# Patient Record
Sex: Female | Born: 1935 | Race: Black or African American | Hispanic: No | Smoking: Former smoker
Health system: Southern US, Community
[De-identification: ages and names within clinical notes are randomized; demographics above are authoritative.]

## PROBLEM LIST (undated history)

## (undated) DIAGNOSIS — E7800 Pure hypercholesterolemia, unspecified: Secondary | ICD-10-CM

## (undated) DIAGNOSIS — H9190 Unspecified hearing loss, unspecified ear: Secondary | ICD-10-CM

## (undated) DIAGNOSIS — E669 Obesity, unspecified: Secondary | ICD-10-CM

## (undated) DIAGNOSIS — M1990 Unspecified osteoarthritis, unspecified site: Secondary | ICD-10-CM

## (undated) DIAGNOSIS — I10 Essential (primary) hypertension: Secondary | ICD-10-CM

## (undated) HISTORY — PX: ABDOMINAL HYSTERECTOMY: SHX81

---

## 2006-04-04 ENCOUNTER — Ambulatory Visit (HOSPITAL_COMMUNITY): Admission: RE | Admit: 2006-04-04 | Discharge: 2006-04-04 | Payer: Self-pay | Admitting: General Surgery

## 2006-04-06 ENCOUNTER — Encounter: Admission: RE | Admit: 2006-04-06 | Discharge: 2006-04-06 | Payer: Self-pay | Admitting: General Surgery

## 2015-06-17 ENCOUNTER — Ambulatory Visit (INDEPENDENT_AMBULATORY_CARE_PROVIDER_SITE_OTHER): Payer: Medicare Other | Admitting: Otolaryngology

## 2015-06-17 DIAGNOSIS — H906 Mixed conductive and sensorineural hearing loss, bilateral: Secondary | ICD-10-CM

## 2015-08-19 ENCOUNTER — Ambulatory Visit (INDEPENDENT_AMBULATORY_CARE_PROVIDER_SITE_OTHER): Payer: Medicare Other | Admitting: Otolaryngology

## 2016-07-28 ENCOUNTER — Encounter (HOSPITAL_COMMUNITY)
Admission: RE | Admit: 2016-07-28 | Discharge: 2016-07-28 | Disposition: A | Discharge status: Home / Self Care | Payer: Medicare Other | Source: Ambulatory Visit | Attending: Ophthalmology | Admitting: Ophthalmology

## 2016-07-28 ENCOUNTER — Encounter (HOSPITAL_COMMUNITY): Payer: Self-pay

## 2016-07-28 ENCOUNTER — Other Ambulatory Visit: Payer: Self-pay

## 2016-07-28 DIAGNOSIS — Z01812 Encounter for preprocedural laboratory examination: Secondary | ICD-10-CM | POA: Diagnosis present

## 2016-07-28 HISTORY — DX: Essential (primary) hypertension: I10

## 2016-07-28 HISTORY — DX: Unspecified hearing loss, unspecified ear: H91.90

## 2016-07-28 HISTORY — DX: Pure hypercholesterolemia, unspecified: E78.00

## 2016-07-28 HISTORY — DX: Unspecified osteoarthritis, unspecified site: M19.90

## 2016-07-28 LAB — CBC WITH DIFFERENTIAL/PLATELET
BASOS ABS: 0 10*3/uL (ref 0.0–0.1)
Basophils Relative: 1 %
EOS PCT: 5 %
Eosinophils Absolute: 0.2 10*3/uL (ref 0.0–0.7)
HCT: 39.2 % (ref 36.0–46.0)
HEMOGLOBIN: 12.5 g/dL (ref 12.0–15.0)
LYMPHS ABS: 1.8 10*3/uL (ref 0.7–4.0)
LYMPHS PCT: 43 %
MCH: 25.6 pg — AB (ref 26.0–34.0)
MCHC: 31.9 g/dL (ref 30.0–36.0)
MCV: 80.3 fL (ref 78.0–100.0)
Monocytes Absolute: 0.6 10*3/uL (ref 0.1–1.0)
Monocytes Relative: 14 %
NEUTROS PCT: 37 %
Neutro Abs: 1.5 10*3/uL — ABNORMAL LOW (ref 1.7–7.7)
PLATELETS: 235 10*3/uL (ref 150–400)
RBC: 4.88 MIL/uL (ref 3.87–5.11)
RDW: 14.5 % (ref 11.5–15.5)
WBC: 4.1 10*3/uL (ref 4.0–10.5)

## 2016-07-28 LAB — BASIC METABOLIC PANEL
ANION GAP: 9 (ref 5–15)
BUN: 18 mg/dL (ref 6–20)
CHLORIDE: 104 mmol/L (ref 101–111)
CO2: 26 mmol/L (ref 22–32)
Calcium: 9.4 mg/dL (ref 8.9–10.3)
Creatinine, Ser: 0.83 mg/dL (ref 0.44–1.00)
GFR calc Af Amer: >60 mL/min (ref >60)
GLUCOSE: 115 mg/dL — AB (ref 65–99)
POTASSIUM: 3.8 mmol/L (ref 3.5–5.1)
SODIUM: 139 mmol/L (ref 135–145)

## 2016-07-28 NOTE — Patient Instructions (Addendum)
Your procedure is scheduled on: 08/04/2016  Report to [facility] at  640   AM.  Call this number if you have problems the morning of surgery: [phone]   Do not eat food or drink liquids :After Midnight.      Take these medicines the morning of surgery with A SIP OF WATER: celebrex, vaseretic.   Do not wear jewelry, make-up or nail polish.  Do not wear lotions, powders, or perfumes. You may wear deodorant.  Do not shave 48 hours prior to surgery.  Do not bring valuables to the hospital.  Contacts, dentures or bridgework may not be worn into surgery.  Leave suitcase in the car. After surgery it may be brought to your room.  For patients admitted to the hospital, checkout time is 11:00 AM the day of discharge.   Patients discharged the day of surgery will not be allowed to drive home.  :     Please read over the following fact sheets that you were given: Coughing and Deep Breathing, Surgical Site Infection Prevention, Anesthesia Post-op Instructions and Care and Recovery After Surgery    Cataract A cataract is a clouding of the lens of the eye. When a lens becomes cloudy, vision is reduced based on the degree and nature of the clouding. Many cataracts reduce vision to some degree. Some cataracts make people more near-sighted as they develop. Other cataracts increase glare. Cataracts that are ignored and become worse can sometimes look white. The white color can be seen through the pupil. CAUSES   Aging. However, cataracts may occur at any age, even in newborns.   Certain drugs.   Trauma to the eye.   Certain diseases such as diabetes.   Specific eye diseases such as chronic inflammation inside the eye or a sudden attack of a rare form of glaucoma.   Inherited or acquired medical problems.  SYMPTOMS   Gradual, progressive drop in vision in the affected eye.   Severe, rapid visual loss. This most often happens when trauma is the cause.  DIAGNOSIS  To detect a cataract, an eye  doctor examines the lens. Cataracts are best diagnosed with an exam of the eyes with the pupils enlarged (dilated) by drops.  TREATMENT  For an early cataract, vision may improve by using different eyeglasses or stronger lighting. If that does not help your vision, surgery is the only effective treatment. A cataract needs to be surgically removed when vision loss interferes with your everyday activities, such as driving, reading, or watching TV. A cataract may also have to be removed if it prevents examination or treatment of another eye problem. Surgery removes the cloudy lens and usually replaces it with a substitute lens (intraocular lens, IOL).  At a time when both you and your doctor agree, the cataract will be surgically removed. If you have cataracts in both eyes, only one is usually removed at a time. This allows the operated eye to heal and be out of danger from any possible problems after surgery (such as infection or poor wound healing). In rare cases, a cataract may be doing damage to your eye. In these cases, your caregiver may advise surgical removal right away. The vast majority of people who have cataract surgery have better vision afterward. HOME CARE INSTRUCTIONS  If you are not planning surgery, you may be asked to do the following:  Use different eyeglasses.   Use stronger or brighter lighting.   Ask your eye doctor about reducing your medicine  dose or changing medicines if it is thought that a medicine caused your cataract. Changing medicines does not make the cataract go away on its own.   Become familiar with your surroundings. Poor vision can lead to injury. Avoid bumping into things on the affected side. You are at a higher risk for tripping or falling.   Exercise extreme care when driving or operating machinery.   Wear sunglasses if you are sensitive to bright light or experiencing problems with glare.  SEEK IMMEDIATE MEDICAL CARE IF:   You have a worsening or sudden  vision loss.   You notice redness, swelling, or increasing pain in the eye.   You have a fever.  Document Released: 02/06/2005 Document Revised: 01/26/2011 Document Reviewed: 09/30/2010 [facility] Patient Information 2012 [place].PATIENT INSTRUCTIONS POST-ANESTHESIA  IMMEDIATELY FOLLOWING SURGERY:  Do not drive or operate machinery for the first twenty four hours after surgery.  Do not make any important decisions for twenty four hours after surgery or while taking narcotic pain medications or sedatives.  If you develop intractable nausea and vomiting or a severe headache please notify your doctor immediately.  FOLLOW-UP:  Please make an appointment with your surgeon as instructed. You do not need to follow up with anesthesia unless specifically instructed to do so.  WOUND CARE INSTRUCTIONS (if applicable):  Keep a dry clean dressing on the anesthesia/puncture wound site if there is drainage.  Once the wound has quit draining you may leave it open to air.  Generally you should leave the bandage intact for twenty four hours unless there is drainage.  If the epidural site drains for more than 36-48 hours please call the anesthesia department.  QUESTIONS?:  Please feel free to call your physician or the hospital operator if you have any questions, and they will be happy to assist you.

## 2016-07-28 NOTE — Progress Notes (Signed)
   07/28/16 0838  OBSTRUCTIVE SLEEP APNEA  Have you ever been diagnosed with sleep apnea through a sleep study? No  Do you snore loudly (loud enough to be heard through closed doors)?  1  Do you often feel tired, fatigued, or sleepy during the daytime (such as falling asleep during driving or talking to someone)? 1  Has anyone observed you stop breathing during your sleep? 0  Do you have, or are you being treated for high blood pressure? 1  BMI more than 35 kg/m2? 1  Age > 50 (1-yes) 1  Neck circumference greater than:Female 16 inches or larger, Female 17inches or larger? 0  Female Gender (Yes=1) 0  Obstructive Sleep Apnea Score 5  Score 5 or greater  Results sent to PCP

## 2016-08-04 ENCOUNTER — Ambulatory Visit (HOSPITAL_COMMUNITY): Payer: Medicare Other | Admitting: Anesthesiology

## 2016-08-04 ENCOUNTER — Encounter (HOSPITAL_COMMUNITY): Payer: Self-pay | Admitting: *Deleted

## 2016-08-04 ENCOUNTER — Ambulatory Visit (HOSPITAL_COMMUNITY)
Admission: RE | Admit: 2016-08-04 | Discharge: 2016-08-04 | Disposition: A | Discharge status: Home / Self Care | Payer: Medicare Other | Source: Ambulatory Visit | Attending: Ophthalmology | Admitting: Ophthalmology

## 2016-08-04 ENCOUNTER — Encounter (HOSPITAL_COMMUNITY)
Admission: RE | Disposition: A | Discharge status: Home / Self Care | Payer: Self-pay | Source: Ambulatory Visit | Attending: Ophthalmology

## 2016-08-04 DIAGNOSIS — Z79899 Other long term (current) drug therapy: Secondary | ICD-10-CM | POA: Insufficient documentation

## 2016-08-04 DIAGNOSIS — I10 Essential (primary) hypertension: Secondary | ICD-10-CM | POA: Insufficient documentation

## 2016-08-04 DIAGNOSIS — E1136 Type 2 diabetes mellitus with diabetic cataract: Secondary | ICD-10-CM | POA: Insufficient documentation

## 2016-08-04 DIAGNOSIS — Z87891 Personal history of nicotine dependence: Secondary | ICD-10-CM | POA: Diagnosis not present

## 2016-08-04 DIAGNOSIS — Z7982 Long term (current) use of aspirin: Secondary | ICD-10-CM | POA: Insufficient documentation

## 2016-08-04 HISTORY — PX: CATARACT EXTRACTION W/PHACO: SHX586

## 2016-08-04 LAB — GLUCOSE, CAPILLARY: GLUCOSE-CAPILLARY: 103 mg/dL — AB (ref 65–99)

## 2016-08-04 SURGERY — PHACOEMULSIFICATION, CATARACT, WITH IOL INSERTION
Anesthesia: Monitor Anesthesia Care | Site: Eye | Laterality: Right

## 2016-08-04 MED ORDER — SODIUM HYALURONATE 23 MG/ML IO SOLN
INTRAOCULAR | Status: DC | PRN
  Administered 2016-08-04: 0.6 mL via INTRAOCULAR

## 2016-08-04 MED ORDER — POVIDONE-IODINE 5 % OP SOLN
OPHTHALMIC | Status: DC | PRN
  Administered 2016-08-04: 1 via OPHTHALMIC

## 2016-08-04 MED ORDER — TETRACAINE HCL 0.5 % OP SOLN
1.0000 [drp] | OPHTHALMIC | Status: AC
  Administered 2016-08-04 (×3): 1 [drp] via OPHTHALMIC

## 2016-08-04 MED ORDER — MIDAZOLAM HCL 2 MG/2ML IJ SOLN
1.0000 mg | INTRAMUSCULAR | Status: AC
  Administered 2016-08-04: 2 mg via INTRAVENOUS

## 2016-08-04 MED ORDER — LIDOCAINE HCL 3.5 % OP GEL
1.0000 "application " | Freq: Once | OPHTHALMIC | Status: AC
  Administered 2016-08-04: 1 via OPHTHALMIC

## 2016-08-04 MED ORDER — EPINEPHRINE PF 1 MG/ML IJ SOLN
INTRAMUSCULAR | Status: DC | PRN
  Administered 2016-08-04: 500 mL

## 2016-08-04 MED ORDER — LIDOCAINE HCL (PF) 1 % IJ SOLN
INTRAMUSCULAR | Status: DC | PRN
  Administered 2016-08-04: 1 mL via OPHTHALMIC

## 2016-08-04 MED ORDER — CYCLOPENTOLATE-PHENYLEPHRINE 0.2-1 % OP SOLN
1.0000 [drp] | OPHTHALMIC | Status: AC
  Administered 2016-08-04 (×3): 1 [drp] via OPHTHALMIC

## 2016-08-04 MED ORDER — FENTANYL CITRATE (PF) 100 MCG/2ML IJ SOLN
INTRAMUSCULAR | Status: AC
  Filled 2016-08-04: qty 2

## 2016-08-04 MED ORDER — MIDAZOLAM HCL 2 MG/2ML IJ SOLN
INTRAMUSCULAR | Status: AC
  Filled 2016-08-04: qty 2

## 2016-08-04 MED ORDER — BSS IO SOLN
INTRAOCULAR | Status: DC | PRN
  Administered 2016-08-04: 15 mL via INTRAOCULAR

## 2016-08-04 MED ORDER — PHENYLEPHRINE HCL 2.5 % OP SOLN
1.0000 [drp] | OPHTHALMIC | Status: AC
  Administered 2016-08-04 (×3): 1 [drp] via OPHTHALMIC

## 2016-08-04 MED ORDER — FENTANYL CITRATE (PF) 100 MCG/2ML IJ SOLN
25.0000 ug | Freq: Once | INTRAMUSCULAR | Status: AC
  Administered 2016-08-04: 25 ug via INTRAVENOUS

## 2016-08-04 MED ORDER — LACTATED RINGERS IV SOLN
INTRAVENOUS | Status: DC
  Administered 2016-08-04: 08:00:00 via INTRAVENOUS

## 2016-08-04 MED ORDER — NEOMYCIN-POLYMYXIN-DEXAMETH 3.5-10000-0.1 OP SUSP
OPHTHALMIC | Status: DC | PRN
  Administered 2016-08-04: 2 [drp] via OPHTHALMIC

## 2016-08-04 MED ORDER — PROVISC 10 MG/ML IO SOLN
INTRAOCULAR | Status: DC | PRN
  Administered 2016-08-04: 0.85 mL via INTRAOCULAR

## 2016-08-04 SURGICAL SUPPLY — 11 items
CLOTH BEACON ORANGE TIMEOUT ST (SAFETY) ×2 IMPLANT
EYE SHIELD UNIVERSAL CLEAR (GAUZE/BANDAGES/DRESSINGS) ×2 IMPLANT
GLOVE BIOGEL PI IND STRL 6.5 (GLOVE) IMPLANT
GLOVE BIOGEL PI INDICATOR 6.5 (GLOVE) ×4
LENS ALC ACRYL/TECN (Ophthalmic Related) ×2 IMPLANT
NDL HYPO 18GX1.5 BLUNT FILL (NEEDLE) IMPLANT
NEEDLE HYPO 18GX1.5 BLUNT FILL (NEEDLE) ×3 IMPLANT
PAD ARMBOARD 7.5X6 YLW CONV (MISCELLANEOUS) ×2 IMPLANT
SYR TB 1ML LL NO SAFETY (SYRINGE) ×2 IMPLANT
TAPE TRANSPARENT 1/2IN (GAUZE/BANDAGES/DRESSINGS) ×2 IMPLANT
WATER STERILE IRR 250ML POUR (IV SOLUTION) ×2 IMPLANT

## 2016-08-04 NOTE — Anesthesia Postprocedure Evaluation (Signed)
Anesthesia Post Note  Patient: Megan Roberts  Procedure(s) Performed: Procedure(s) (LRB): CATARACT EXTRACTION PHACO AND INTRAOCULAR LENS PLACEMENT RIGHT EYE (Right)  Patient location during evaluation: Short Stay Anesthesia Type: MAC Level of consciousness: awake and patient cooperative Pain management: pain level controlled Vital Signs Assessment: post-procedure vital signs reviewed and stable Respiratory status: spontaneous breathing, nonlabored ventilation and respiratory function stable Cardiovascular status: blood pressure returned to baseline Postop Assessment: no signs of nausea or vomiting Anesthetic complications: no     Last Vitals:  Vitals:   08/04/16 0810 08/04/16 0815  BP: 120/60 123/61  Pulse:    Resp: 20 15  Temp:      Last Pain:  Vitals:   08/04/16 0716  TempSrc: Oral                 Addie Alonge J

## 2016-08-04 NOTE — Discharge Instructions (Signed)
Please discharge patient when stable, will follow up today [facility] at [facility]:40[facility].  Leave shield in place until visit.  All paperwork with discharge instructions will be given at the office.

## 2016-08-04 NOTE — Anesthesia Preprocedure Evaluation (Signed)
Anesthesia Evaluation  Patient identified by MRN, date of birth, ID band Patient awake    Reviewed: Allergy & Precautions, NPO status , Patient's Chart, lab work & pertinent test results  Airway        Dental   Pulmonary former smoker,           Cardiovascular hypertension, Pt. on medications      Neuro/Psych    GI/Hepatic negative GI ROS, Neg liver ROS,   Endo/Other    Renal/GU      Musculoskeletal   Abdominal   Peds  Hematology   Anesthesia Other Findings   Reproductive/Obstetrics                             Anesthesia Physical Anesthesia Plan  ASA: II  Anesthesia Plan: MAC   Post-op Pain Management:    Induction: Intravenous  PONV Risk Score and Plan:   Airway Management Planned: Nasal Cannula  Additional Equipment:   Intra-op Plan:   Post-operative Plan:   Informed Consent: I have reviewed the patients History and Physical, chart, labs and discussed the procedure including the risks, benefits and alternatives for the proposed anesthesia with the patient or authorized representative who has indicated his/her understanding and acceptance.     Plan Discussed with:   Anesthesia Plan Comments:         Anesthesia Quick Evaluation

## 2016-08-04 NOTE — Transfer of Care (Signed)
Immediate Anesthesia Transfer of Care Note  Patient: Megan Roberts  Procedure(s) Performed: Procedure(s) with comments: CATARACT EXTRACTION PHACO AND INTRAOCULAR LENS PLACEMENT RIGHT EYE (Right) - CDE: 20.23  Patient Location: Short Stay  Anesthesia Type:MAC  Level of Consciousness: awake and patient cooperative  Airway & Oxygen Therapy: Patient Spontanous Breathing  Post-op Assessment: Report given to RN  Post vital signs: Reviewed and stable  Last Vitals:  Vitals:   08/04/16 0810 08/04/16 0815  BP: 120/60 123/61  Pulse:    Resp: 20 15  Temp:      Last Pain:  Vitals:   08/04/16 0716  TempSrc: Oral      Patients Stated Pain Goal: 5 (44/81/85 6314)  Complications: No apparent anesthesia complications

## 2016-08-04 NOTE — Op Note (Signed)
Date of procedure: 08/04/16  Pre-operative diagnosis: Visually significant cataract, Right Eye  Post-operative diagnosis: Visually significant cataract, Right Eye  Procedure: Removal of cataract via phacoemulsification and insertion of intra-ocular lens AMO PCB00  +22.0D into the capsular bag of the Right Eye  Attending surgeon: Gerda Diss. Brandey Vandalen, MD, MA  Anesthesia: MAC, Topical [place]  Complications: None  Estimated Blood Loss: <13m (minimal)  Specimens: None  Implants: As above  Indications:  Visually significant cataract, Right Eye  Procedure:  The patient was seen and identified in the pre-operative area. The operative eye was identified and dilated.  The operative eye was marked.  Topical anesthesia was administered to the operative eye.     The patient was then to the operative suite and placed in the supine position.  A timeout was performed confirming the patient, procedure to be performed, and all other relevant information.   The patient's face was prepped and draped in the usual fashion for intra-ocular surgery.  A lid speculum was placed into the operative eye and the surgical microscope moved into place and focused.  A superotemporal paracentesis was created using a 20 gauge paracentesis blade.  Shugarcaine was injected into the anterior chamber.  Viscoelastic was injected into the anterior chamber.  A temporal clear-corneal main wound incision was created using a 2.455mmicrokeratome.  A continuous curvilinear capsulorrhexis was initiated using an irrigating cystitome and completed using capsulorrhexis forceps.  Hydrodissection and hydrodeliniation were performed.  Viscoelastic was injected into the anterior chamber.  A phacoemulsification handpiece and a chopper as a second instrument were used to remove the nucleus and epinucleus. The irrigation/aspiration handpiece was used to remove any remaining cortical material.   The capsular bag was reinflated with viscoelastic,  checked, and found to be intact.  The intraocular lens was inserted into the capsular bag and dialed into place using a Kuglen hook.  The irrigation/aspiration handpiece was used to remove any remaining viscoelastic.  The clear corneal wound and paracentesis wounds were then hydrated and checked with Weck-Cels to be watertight.  The lid-speculum and drape was removed, and the patient's face was cleaned with a wet and dry 4x4.  Maxitrol was instilled in the eye before a clear shield was taped over the eye. The patient was taken to the post-operative care unit in good condition, having tolerated the procedure well.  Post-Op Instructions: [facility] and will receive all other orders and instructions.

## 2016-08-04 NOTE — H&P (Signed)
The H and P was reviewed and updated. The patient was examined.  No changes were found after exam.  The surgical eye was marked.  

## 2016-08-07 ENCOUNTER — Encounter (HOSPITAL_COMMUNITY): Payer: Self-pay | Admitting: Ophthalmology

## 2016-08-22 ENCOUNTER — Encounter (HOSPITAL_COMMUNITY): Payer: Self-pay

## 2016-08-22 ENCOUNTER — Encounter (HOSPITAL_COMMUNITY)
Admission: RE | Admit: 2016-08-22 | Discharge: 2016-08-22 | Disposition: A | Discharge status: Home / Self Care | Payer: Medicare Other | Source: Ambulatory Visit | Attending: Ophthalmology | Admitting: Ophthalmology

## 2016-09-04 ENCOUNTER — Encounter (HOSPITAL_COMMUNITY): Payer: Self-pay

## 2016-09-04 ENCOUNTER — Encounter (HOSPITAL_COMMUNITY)
Admission: RE | Admit: 2016-09-04 | Discharge: 2016-09-04 | Disposition: A | Discharge status: Home / Self Care | Payer: Medicare Other | Source: Ambulatory Visit | Attending: Ophthalmology | Admitting: Ophthalmology

## 2016-10-16 NOTE — Patient Instructions (Signed)
Your procedure is scheduled on: 10/27/2016   Report to [facility] at  710  AM.  Call this number if you have problems the morning of surgery: [phone]   Do not eat food or drink liquids :After Midnight.      Take these medicines the morning of surgery with A SIP OF WATER: vaseretic.   Do not wear jewelry, make-up or nail polish.  Do not wear lotions, powders, or perfumes. You may wear deodorant.  Do not shave 48 hours prior to surgery.  Do not bring valuables to the hospital.  Contacts, dentures or bridgework may not be worn into surgery.  Leave suitcase in the car. After surgery it may be brought to your room.  For patients admitted to the hospital, checkout time is 11:00 AM the day of discharge.   Patients discharged the day of surgery will not be allowed to drive home.  :     Please read over the following fact sheets that you were given: Coughing and Deep Breathing, Surgical Site Infection Prevention, Anesthesia Post-op Instructions and Care and Recovery After Surgery    Cataract A cataract is a clouding of the lens of the eye. When a lens becomes cloudy, vision is reduced based on the degree and nature of the clouding. Many cataracts reduce vision to some degree. Some cataracts make people more near-sighted as they develop. Other cataracts increase glare. Cataracts that are ignored and become worse can sometimes look white. The white color can be seen through the pupil. CAUSES   Aging. However, cataracts may occur at any age, even in newborns.   Certain drugs.   Trauma to the eye.   Certain diseases such as diabetes.   Specific eye diseases such as chronic inflammation inside the eye or a sudden attack of a rare form of glaucoma.   Inherited or acquired medical problems.  SYMPTOMS   Gradual, progressive drop in vision in the affected eye.   Severe, rapid visual loss. This most often happens when trauma is the cause.  DIAGNOSIS  To detect a cataract, an eye doctor  examines the lens. Cataracts are best diagnosed with an exam of the eyes with the pupils enlarged (dilated) by drops.  TREATMENT  For an early cataract, vision may improve by using different eyeglasses or stronger lighting. If that does not help your vision, surgery is the only effective treatment. A cataract needs to be surgically removed when vision loss interferes with your everyday activities, such as driving, reading, or watching TV. A cataract may also have to be removed if it prevents examination or treatment of another eye problem. Surgery removes the cloudy lens and usually replaces it with a substitute lens (intraocular lens, IOL).  At a time when both you and your doctor agree, the cataract will be surgically removed. If you have cataracts in both eyes, only one is usually removed at a time. This allows the operated eye to heal and be out of danger from any possible problems after surgery (such as infection or poor wound healing). In rare cases, a cataract may be doing damage to your eye. In these cases, your caregiver may advise surgical removal right away. The vast majority of people who have cataract surgery have better vision afterward. HOME CARE INSTRUCTIONS  If you are not planning surgery, you may be asked to do the following:  Use different eyeglasses.   Use stronger or brighter lighting.   Ask your eye doctor about reducing your medicine dose  or changing medicines if it is thought that a medicine caused your cataract. Changing medicines does not make the cataract go away on its own.   Become familiar with your surroundings. Poor vision can lead to injury. Avoid bumping into things on the affected side. You are at a higher risk for tripping or falling.   Exercise extreme care when driving or operating machinery.   Wear sunglasses if you are sensitive to bright light or experiencing problems with glare.  SEEK IMMEDIATE MEDICAL CARE IF:   You have a worsening or sudden vision  loss.   You notice redness, swelling, or increasing pain in the eye.   You have a fever.  Document Released: 02/06/2005 Document Revised: 01/26/2011 Document Reviewed: 09/30/2010 [facility] Patient Information 2012 [place].PATIENT INSTRUCTIONS POST-ANESTHESIA  IMMEDIATELY FOLLOWING SURGERY:  Do not drive or operate machinery for the first twenty four hours after surgery.  Do not make any important decisions for twenty four hours after surgery or while taking narcotic pain medications or sedatives.  If you develop intractable nausea and vomiting or a severe headache please notify your doctor immediately.  FOLLOW-UP:  Please make an appointment with your surgeon as instructed. You do not need to follow up with anesthesia unless specifically instructed to do so.  WOUND CARE INSTRUCTIONS (if applicable):  Keep a dry clean dressing on the anesthesia/puncture wound site if there is drainage.  Once the wound has quit draining you may leave it open to air.  Generally you should leave the bandage intact for twenty four hours unless there is drainage.  If the epidural site drains for more than 36-48 hours please call the anesthesia department.  QUESTIONS?:  Please feel free to call your physician or the hospital operator if you have any questions, and they will be happy to assist you.

## 2016-10-20 ENCOUNTER — Encounter (HOSPITAL_COMMUNITY)
Admission: RE | Admit: 2016-10-20 | Discharge: 2016-10-20 | Disposition: A | Discharge status: Home / Self Care | Payer: Medicare Other | Source: Ambulatory Visit | Attending: Ophthalmology | Admitting: Ophthalmology

## 2016-10-27 ENCOUNTER — Encounter (HOSPITAL_COMMUNITY): Admission: RE | Payer: Self-pay | Source: Ambulatory Visit

## 2016-10-27 ENCOUNTER — Ambulatory Visit (HOSPITAL_COMMUNITY): Admission: RE | Admit: 2016-10-27 | Payer: Medicare Other | Source: Ambulatory Visit | Admitting: Ophthalmology

## 2016-10-27 SURGERY — PHACOEMULSIFICATION, CATARACT, WITH IOL INSERTION
Anesthesia: Monitor Anesthesia Care | Laterality: Left

## 2016-11-06 ENCOUNTER — Inpatient Hospital Stay (HOSPITAL_COMMUNITY)
Admission: EM | Admit: 2016-11-06 | Discharge: 2016-11-20 | DRG: 871 | Disposition: E | Payer: Medicare Other | Attending: Pulmonary Disease | Admitting: Pulmonary Disease

## 2016-11-06 ENCOUNTER — Encounter (HOSPITAL_COMMUNITY): Payer: Self-pay | Admitting: Emergency Medicine

## 2016-11-06 DIAGNOSIS — E7800 Pure hypercholesterolemia, unspecified: Secondary | ICD-10-CM | POA: Diagnosis present

## 2016-11-06 DIAGNOSIS — K922 Gastrointestinal hemorrhage, unspecified: Secondary | ICD-10-CM

## 2016-11-06 DIAGNOSIS — K559 Vascular disorder of intestine, unspecified: Secondary | ICD-10-CM | POA: Diagnosis present

## 2016-11-06 DIAGNOSIS — I10 Essential (primary) hypertension: Secondary | ICD-10-CM | POA: Diagnosis present

## 2016-11-06 DIAGNOSIS — I469 Cardiac arrest, cause unspecified: Secondary | ICD-10-CM

## 2016-11-06 DIAGNOSIS — R569 Unspecified convulsions: Secondary | ICD-10-CM | POA: Diagnosis present

## 2016-11-06 DIAGNOSIS — N170 Acute kidney failure with tubular necrosis: Secondary | ICD-10-CM | POA: Diagnosis present

## 2016-11-06 DIAGNOSIS — D65 Disseminated intravascular coagulation [defibrination syndrome]: Secondary | ICD-10-CM | POA: Diagnosis not present

## 2016-11-06 DIAGNOSIS — Z789 Other specified health status: Secondary | ICD-10-CM

## 2016-11-06 DIAGNOSIS — R739 Hyperglycemia, unspecified: Secondary | ICD-10-CM | POA: Diagnosis present

## 2016-11-06 DIAGNOSIS — R6521 Severe sepsis with septic shock: Secondary | ICD-10-CM | POA: Diagnosis present

## 2016-11-06 DIAGNOSIS — R570 Cardiogenic shock: Secondary | ICD-10-CM | POA: Diagnosis present

## 2016-11-06 DIAGNOSIS — Z4659 Encounter for fitting and adjustment of other gastrointestinal appliance and device: Secondary | ICD-10-CM

## 2016-11-06 DIAGNOSIS — R791 Abnormal coagulation profile: Secondary | ICD-10-CM | POA: Diagnosis present

## 2016-11-06 DIAGNOSIS — I4510 Unspecified right bundle-branch block: Secondary | ICD-10-CM | POA: Diagnosis present

## 2016-11-06 DIAGNOSIS — H9190 Unspecified hearing loss, unspecified ear: Secondary | ICD-10-CM | POA: Diagnosis present

## 2016-11-06 DIAGNOSIS — Z6841 Body Mass Index (BMI) 40.0 and over, adult: Secondary | ICD-10-CM

## 2016-11-06 DIAGNOSIS — R34 Anuria and oliguria: Secondary | ICD-10-CM | POA: Diagnosis present

## 2016-11-06 DIAGNOSIS — J9601 Acute respiratory failure with hypoxia: Secondary | ICD-10-CM | POA: Diagnosis present

## 2016-11-06 DIAGNOSIS — R0989 Other specified symptoms and signs involving the circulatory and respiratory systems: Secondary | ICD-10-CM

## 2016-11-06 DIAGNOSIS — Z87891 Personal history of nicotine dependence: Secondary | ICD-10-CM

## 2016-11-06 DIAGNOSIS — Z66 Do not resuscitate: Secondary | ICD-10-CM | POA: Diagnosis present

## 2016-11-06 DIAGNOSIS — K7200 Acute and subacute hepatic failure without coma: Secondary | ICD-10-CM | POA: Diagnosis present

## 2016-11-06 DIAGNOSIS — Z961 Presence of intraocular lens: Secondary | ICD-10-CM | POA: Diagnosis present

## 2016-11-06 DIAGNOSIS — A419 Sepsis, unspecified organism: Secondary | ICD-10-CM | POA: Diagnosis not present

## 2016-11-06 DIAGNOSIS — D649 Anemia, unspecified: Secondary | ICD-10-CM | POA: Diagnosis present

## 2016-11-06 DIAGNOSIS — Z90710 Acquired absence of both cervix and uterus: Secondary | ICD-10-CM

## 2016-11-06 DIAGNOSIS — E872 Acidosis: Secondary | ICD-10-CM | POA: Diagnosis present

## 2016-11-06 DIAGNOSIS — G931 Anoxic brain damage, not elsewhere classified: Secondary | ICD-10-CM | POA: Diagnosis present

## 2016-11-06 DIAGNOSIS — J181 Lobar pneumonia, unspecified organism: Secondary | ICD-10-CM

## 2016-11-06 DIAGNOSIS — Z7982 Long term (current) use of aspirin: Secondary | ICD-10-CM

## 2016-11-06 DIAGNOSIS — E785 Hyperlipidemia, unspecified: Secondary | ICD-10-CM | POA: Diagnosis present

## 2016-11-06 DIAGNOSIS — I214 Non-ST elevation (NSTEMI) myocardial infarction: Secondary | ICD-10-CM | POA: Diagnosis present

## 2016-11-06 DIAGNOSIS — K921 Melena: Secondary | ICD-10-CM | POA: Diagnosis present

## 2016-11-06 DIAGNOSIS — J189 Pneumonia, unspecified organism: Secondary | ICD-10-CM | POA: Diagnosis present

## 2016-11-06 DIAGNOSIS — Z79899 Other long term (current) drug therapy: Secondary | ICD-10-CM

## 2016-11-06 HISTORY — DX: Obesity, unspecified: E66.9

## 2016-11-06 LAB — CBG MONITORING, ED: GLUCOSE-CAPILLARY: 276 mg/dL — AB (ref 65–99)

## 2016-11-07 ENCOUNTER — Inpatient Hospital Stay (HOSPITAL_COMMUNITY): Payer: Medicare Other

## 2016-11-07 ENCOUNTER — Emergency Department (HOSPITAL_COMMUNITY): Payer: Medicare Other

## 2016-11-07 ENCOUNTER — Other Ambulatory Visit (HOSPITAL_COMMUNITY): Payer: Medicare Other

## 2016-11-07 DIAGNOSIS — Z789 Other specified health status: Secondary | ICD-10-CM

## 2016-11-07 DIAGNOSIS — R569 Unspecified convulsions: Secondary | ICD-10-CM | POA: Diagnosis present

## 2016-11-07 DIAGNOSIS — J189 Pneumonia, unspecified organism: Secondary | ICD-10-CM | POA: Diagnosis present

## 2016-11-07 DIAGNOSIS — Z66 Do not resuscitate: Secondary | ICD-10-CM | POA: Diagnosis present

## 2016-11-07 DIAGNOSIS — N170 Acute kidney failure with tubular necrosis: Secondary | ICD-10-CM | POA: Diagnosis present

## 2016-11-07 DIAGNOSIS — I4510 Unspecified right bundle-branch block: Secondary | ICD-10-CM | POA: Diagnosis not present

## 2016-11-07 DIAGNOSIS — D649 Anemia, unspecified: Secondary | ICD-10-CM | POA: Diagnosis present

## 2016-11-07 DIAGNOSIS — R0989 Other specified symptoms and signs involving the circulatory and respiratory systems: Secondary | ICD-10-CM | POA: Diagnosis not present

## 2016-11-07 DIAGNOSIS — H9190 Unspecified hearing loss, unspecified ear: Secondary | ICD-10-CM | POA: Diagnosis present

## 2016-11-07 DIAGNOSIS — K559 Vascular disorder of intestine, unspecified: Secondary | ICD-10-CM | POA: Diagnosis present

## 2016-11-07 DIAGNOSIS — I469 Cardiac arrest, cause unspecified: Secondary | ICD-10-CM | POA: Diagnosis present

## 2016-11-07 DIAGNOSIS — R739 Hyperglycemia, unspecified: Secondary | ICD-10-CM | POA: Diagnosis present

## 2016-11-07 DIAGNOSIS — J181 Lobar pneumonia, unspecified organism: Secondary | ICD-10-CM

## 2016-11-07 DIAGNOSIS — J9601 Acute respiratory failure with hypoxia: Secondary | ICD-10-CM | POA: Insufficient documentation

## 2016-11-07 DIAGNOSIS — A419 Sepsis, unspecified organism: Secondary | ICD-10-CM | POA: Diagnosis present

## 2016-11-07 DIAGNOSIS — K922 Gastrointestinal hemorrhage, unspecified: Secondary | ICD-10-CM

## 2016-11-07 DIAGNOSIS — G931 Anoxic brain damage, not elsewhere classified: Secondary | ICD-10-CM | POA: Diagnosis present

## 2016-11-07 DIAGNOSIS — Z6841 Body Mass Index (BMI) 40.0 and over, adult: Secondary | ICD-10-CM | POA: Diagnosis not present

## 2016-11-07 DIAGNOSIS — R791 Abnormal coagulation profile: Secondary | ICD-10-CM | POA: Diagnosis present

## 2016-11-07 DIAGNOSIS — R34 Anuria and oliguria: Secondary | ICD-10-CM | POA: Diagnosis present

## 2016-11-07 DIAGNOSIS — E872 Acidosis: Secondary | ICD-10-CM | POA: Diagnosis present

## 2016-11-07 DIAGNOSIS — E785 Hyperlipidemia, unspecified: Secondary | ICD-10-CM | POA: Diagnosis present

## 2016-11-07 DIAGNOSIS — I10 Essential (primary) hypertension: Secondary | ICD-10-CM | POA: Diagnosis present

## 2016-11-07 DIAGNOSIS — I214 Non-ST elevation (NSTEMI) myocardial infarction: Secondary | ICD-10-CM | POA: Diagnosis present

## 2016-11-07 DIAGNOSIS — R6521 Severe sepsis with septic shock: Secondary | ICD-10-CM | POA: Diagnosis present

## 2016-11-07 DIAGNOSIS — R570 Cardiogenic shock: Secondary | ICD-10-CM | POA: Diagnosis present

## 2016-11-07 DIAGNOSIS — D65 Disseminated intravascular coagulation [defibrination syndrome]: Secondary | ICD-10-CM | POA: Diagnosis not present

## 2016-11-07 DIAGNOSIS — K921 Melena: Secondary | ICD-10-CM | POA: Diagnosis present

## 2016-11-07 DIAGNOSIS — K7200 Acute and subacute hepatic failure without coma: Secondary | ICD-10-CM | POA: Diagnosis present

## 2016-11-07 LAB — COMPREHENSIVE METABOLIC PANEL
ALK PHOS: 90 U/L (ref 38–126)
ALT: 211 U/L — ABNORMAL HIGH (ref 14–54)
ANION GAP: 9 (ref 5–15)
AST: 478 U/L — ABNORMAL HIGH (ref 15–41)
Albumin: 2 g/dL — ABNORMAL LOW (ref 3.5–5.0)
BILIRUBIN TOTAL: 0.9 mg/dL (ref 0.3–1.2)
BUN: 18 mg/dL (ref 6–20)
CO2: 16 mmol/L — AB (ref 22–32)
CREATININE: 2.16 mg/dL — AB (ref 0.44–1.00)
Calcium: 7.1 mg/dL — ABNORMAL LOW (ref 8.9–10.3)
Chloride: 115 mmol/L — ABNORMAL HIGH (ref 101–111)
GFR calc non Af Amer: 21 mL/min — ABNORMAL LOW (ref >60)
GFR, EST AFRICAN AMERICAN: 24 mL/min — AB (ref >60)
GLUCOSE: 164 mg/dL — AB (ref 65–99)
Potassium: 4.4 mmol/L (ref 3.5–5.1)
SODIUM: 140 mmol/L (ref 135–145)
TOTAL PROTEIN: 4 g/dL — AB (ref 6.5–8.1)

## 2016-11-07 LAB — I-STAT ARTERIAL BLOOD GAS, ED
ACID-BASE DEFICIT: 11 mmol/L — AB (ref 0.0–2.0)
BICARBONATE: 19.8 mmol/L — AB (ref 20.0–28.0)
O2 Saturation: 95 %
TCO2: 22 mmol/L (ref 22–32)
pCO2 arterial: 69.4 mmHg (ref 32.0–48.0)
pH, Arterial: 7.062 — CL (ref 7.350–7.450)
pO2, Arterial: 109 mmHg — ABNORMAL HIGH (ref 83.0–108.0)

## 2016-11-07 LAB — TYPE AND SCREEN
ABO/RH(D): O POS
ANTIBODY SCREEN: NEGATIVE
UNIT DIVISION: 0
UNIT DIVISION: 0
Unit division: 0
Unit division: 0

## 2016-11-07 LAB — BLOOD GAS, ARTERIAL
Acid-base deficit: 12.5 mmol/L — ABNORMAL HIGH (ref 0.0–2.0)
BICARBONATE: 15.1 mmol/L — AB (ref 20.0–28.0)
Drawn by: 398981
FIO2: 100
LHR: 26 {breaths}/min
O2 SAT: 96 %
PATIENT TEMPERATURE: 96.3
PEEP: 5 cmH2O
VT: 530 mL
pCO2 arterial: 45.2 mmHg (ref 32.0–48.0)
pH, Arterial: 7.139 — CL (ref 7.350–7.450)
pO2, Arterial: 107 mmHg (ref 83.0–108.0)

## 2016-11-07 LAB — POCT I-STAT 3, ART BLOOD GAS (G3+)
ACID-BASE DEFICIT: 18 mmol/L — AB (ref 0.0–2.0)
BICARBONATE: 12.9 mmol/L — AB (ref 20.0–28.0)
O2 SAT: 95 %
PH ART: 6.997 — AB (ref 7.350–7.450)
TCO2: 15 mmol/L — AB (ref 22–32)
pCO2 arterial: 51.9 mmHg — ABNORMAL HIGH (ref 32.0–48.0)
pO2, Arterial: 108 mmHg (ref 83.0–108.0)

## 2016-11-07 LAB — HEMOGLOBIN A1C
HEMOGLOBIN A1C: 6.1 % — AB (ref 4.8–5.6)
Mean Plasma Glucose: 128.37 mg/dL

## 2016-11-07 LAB — I-STAT CHEM 8, ED
BUN: 23 mg/dL — AB (ref 6–20)
CALCIUM ION: 1.09 mmol/L — AB (ref 1.15–1.40)
CHLORIDE: 105 mmol/L (ref 101–111)
Creatinine, Ser: 1.4 mg/dL — ABNORMAL HIGH (ref 0.44–1.00)
GLUCOSE: 283 mg/dL — AB (ref 65–99)
HCT: 41 % (ref 36.0–46.0)
Hemoglobin: 13.9 g/dL (ref 12.0–15.0)
Potassium: 4.3 mmol/L (ref 3.5–5.1)
Sodium: 138 mmol/L (ref 135–145)
TCO2: 18 mmol/L — AB (ref 22–32)

## 2016-11-07 LAB — I-STAT TROPONIN, ED: Troponin i, poc: 0.41 ng/mL (ref 0.00–0.08)

## 2016-11-07 LAB — I-STAT CG4 LACTIC ACID, ED: Lactic Acid, Venous: 10.95 mmol/L (ref 0.5–1.9)

## 2016-11-07 LAB — LACTIC ACID, PLASMA
LACTIC ACID, VENOUS: 8 mmol/L — AB (ref 0.5–1.9)
LACTIC ACID, VENOUS: 11.3 mmol/L — AB (ref 0.5–1.9)

## 2016-11-07 LAB — CBC WITH DIFFERENTIAL/PLATELET
BASOS PCT: 0 %
Basophils Absolute: 0 10*3/uL (ref 0.0–0.1)
Eosinophils Absolute: 0.1 10*3/uL (ref 0.0–0.7)
Eosinophils Relative: 0 %
HEMATOCRIT: 28.9 % — AB (ref 36.0–46.0)
HEMOGLOBIN: 8.9 g/dL — AB (ref 12.0–15.0)
LYMPHS ABS: 3.8 10*3/uL (ref 0.7–4.0)
LYMPHS PCT: 22 %
MCH: 25.4 pg — AB (ref 26.0–34.0)
MCHC: 30.8 g/dL (ref 30.0–36.0)
MCV: 82.3 fL (ref 78.0–100.0)
MONO ABS: 1.4 10*3/uL — AB (ref 0.1–1.0)
MONOS PCT: 8 %
NEUTROS ABS: 11.8 10*3/uL — AB (ref 1.7–7.7)
NEUTROS PCT: 69 %
Platelets: 120 10*3/uL — ABNORMAL LOW (ref 150–400)
RBC: 3.51 MIL/uL — ABNORMAL LOW (ref 3.87–5.11)
RDW: 14.7 % (ref 11.5–15.5)
WBC: 17.1 10*3/uL — ABNORMAL HIGH (ref 4.0–10.5)

## 2016-11-07 LAB — GLUCOSE, CAPILLARY: Glucose-Capillary: 161 mg/dL — ABNORMAL HIGH (ref 65–99)

## 2016-11-07 LAB — D-DIMER, QUANTITATIVE: D-Dimer, Quant: >20 ug/mL-FEU — ABNORMAL HIGH (ref 0.00–0.50)

## 2016-11-07 LAB — BPAM RBC
BLOOD PRODUCT EXPIRATION DATE: 201809222359
BLOOD PRODUCT EXPIRATION DATE: 201809242359
BLOOD PRODUCT EXPIRATION DATE: 201810162359
Blood Product Expiration Date: 201810162359
ISSUE DATE / TIME: 201809121125
UNIT TYPE AND RH: 5100
UNIT TYPE AND RH: 5100
Unit Type and Rh: 5100
Unit Type and Rh: 5100

## 2016-11-07 LAB — MAGNESIUM: Magnesium: 2 mg/dL (ref 1.7–2.4)

## 2016-11-07 LAB — MRSA PCR SCREENING: MRSA BY PCR: NEGATIVE

## 2016-11-07 LAB — ABO/RH: ABO/RH(D): O POS

## 2016-11-07 LAB — PHOSPHORUS: Phosphorus: 8.2 mg/dL — ABNORMAL HIGH (ref 2.5–4.6)

## 2016-11-07 LAB — APTT: APTT: 134 s — AB (ref 24–36)

## 2016-11-07 LAB — PREPARE RBC (CROSSMATCH)

## 2016-11-07 LAB — PROTIME-INR
INR: 3.97
PROTHROMBIN TIME: 38.5 s — AB (ref 11.4–15.2)

## 2016-11-07 LAB — BRAIN NATRIURETIC PEPTIDE: B Natriuretic Peptide: 571.7 pg/mL — ABNORMAL HIGH (ref 0.0–100.0)

## 2016-11-07 LAB — POC OCCULT BLOOD, ED: FECAL OCCULT BLD: POSITIVE — AB

## 2016-11-07 MED ORDER — SODIUM CHLORIDE 0.9 % IV SOLN
80.0000 mg | Freq: Once | INTRAVENOUS | Status: AC
  Administered 2016-11-07: 80 mg via INTRAVENOUS
  Filled 2016-11-07: qty 80

## 2016-11-07 MED ORDER — VANCOMYCIN HCL 10 G IV SOLR
1500.0000 mg | INTRAVENOUS | Status: DC
  Administered 2016-11-07: 1500 mg via INTRAVENOUS
  Filled 2016-11-07: qty 1500

## 2016-11-07 MED ORDER — PANTOPRAZOLE SODIUM 40 MG IV SOLR: 40.0000 mg | Freq: Two times a day (BID) | INTRAVENOUS | Status: DC

## 2016-11-07 MED ORDER — FENTANYL 2500MCG IN NS 250ML (10MCG/ML) PREMIX INFUSION: 25.0000 ug/h | INTRAVENOUS | Status: DC

## 2016-11-07 MED ORDER — FENTANYL CITRATE (PF) 100 MCG/2ML IJ SOLN: 50.0000 ug | Freq: Once | INTRAMUSCULAR | Status: DC

## 2016-11-07 MED ORDER — EPINEPHRINE PF 1 MG/10ML IJ SOSY
0.1000 mg | PREFILLED_SYRINGE | Freq: Once | INTRAMUSCULAR | Status: AC
  Administered 2016-11-07: 0.1 mg via INTRAVENOUS

## 2016-11-07 MED ORDER — SODIUM CHLORIDE 0.9 % IV BOLUS (SEPSIS)
1000.0000 mL | Freq: Once | INTRAVENOUS | Status: AC
  Administered 2016-11-07: 1000 mL via INTRAVENOUS

## 2016-11-07 MED ORDER — LEVOFLOXACIN IN D5W 750 MG/150ML IV SOLN: 750.0000 mg | INTRAVENOUS | Status: DC

## 2016-11-07 MED ORDER — SODIUM CHLORIDE 0.9 % IV SOLN: 250.0000 mL | INTRAVENOUS | Status: DC | PRN

## 2016-11-07 MED ORDER — HYDROCORTISONE NA SUCCINATE PF 100 MG IJ SOLR
100.0000 mg | Freq: Three times a day (TID) | INTRAMUSCULAR | Status: DC
  Administered 2016-11-07: 100 mg via INTRAVENOUS
  Filled 2016-11-07: qty 2

## 2016-11-07 MED ORDER — HEPARIN BOLUS VIA INFUSION
4000.0000 [IU] | Freq: Once | INTRAVENOUS | Status: DC
  Filled 2016-11-07: qty 4000

## 2016-11-07 MED ORDER — HEPARIN (PORCINE) IN NACL 100-0.45 UNIT/ML-% IJ SOLN
1400.0000 [IU]/h | INTRAMUSCULAR | Status: DC
  Filled 2016-11-07: qty 250

## 2016-11-07 MED ORDER — SODIUM CHLORIDE 0.9 % IV SOLN: INTRAVENOUS | Status: DC

## 2016-11-07 MED ORDER — FENTANYL BOLUS VIA INFUSION
25.0000 ug | INTRAVENOUS | Status: DC | PRN
  Filled 2016-11-07: qty 25

## 2016-11-07 MED ORDER — INSULIN ASPART 100 UNIT/ML ~~LOC~~ SOLN: 2.0000 [IU] | SUBCUTANEOUS | Status: DC

## 2016-11-07 MED ORDER — SODIUM CHLORIDE 0.9 % IV BOLUS (SEPSIS): 1000.0000 mL | Freq: Once | INTRAVENOUS | Status: DC

## 2016-11-07 MED ORDER — IOPAMIDOL (ISOVUE-370) INJECTION 76%
INTRAVENOUS | Status: AC
  Filled 2016-11-07: qty 100

## 2016-11-07 MED ORDER — PIPERACILLIN-TAZOBACTAM 3.375 G IVPB
3.3750 g | Freq: Three times a day (TID) | INTRAVENOUS | Status: DC
  Filled 2016-11-07 (×2): qty 50

## 2016-11-07 MED ORDER — NOREPINEPHRINE BITARTRATE 1 MG/ML IV SOLN
0.0000 ug/min | INTRAVENOUS | Status: DC
  Administered 2016-11-07 (×2): 40 ug/min via INTRAVENOUS
  Filled 2016-11-07 (×3): qty 4

## 2016-11-07 MED ORDER — STERILE WATER FOR INJECTION IV SOLN
INTRAVENOUS | Status: DC
  Administered 2016-11-07 (×2): via INTRAVENOUS
  Filled 2016-11-07 (×4): qty 850

## 2016-11-07 MED ORDER — SODIUM BICARBONATE 8.4 % IV SOLN
INTRAVENOUS | Status: AC
  Filled 2016-11-07: qty 50

## 2016-11-07 MED ORDER — FENTANYL 2500MCG IN NS 250ML (10MCG/ML) PREMIX INFUSION
25.0000 ug/h | INTRAVENOUS | Status: DC
  Administered 2016-11-07: 50 ug/h via INTRAVENOUS
  Filled 2016-11-07: qty 250

## 2016-11-07 MED ORDER — ETOMIDATE 2 MG/ML IV SOLN
20.0000 mg | Freq: Once | INTRAVENOUS | Status: AC
  Administered 2016-11-07: 20 mg via INTRAVENOUS

## 2016-11-07 MED ORDER — MIDAZOLAM HCL 2 MG/2ML IJ SOLN: 1.0000 mg | INTRAMUSCULAR | Status: DC | PRN

## 2016-11-07 MED ORDER — SODIUM CHLORIDE 0.9 % IV SOLN: Freq: Once | INTRAVENOUS | Status: DC

## 2016-11-07 MED ORDER — SODIUM BICARBONATE 8.4 % IV SOLN
INTRAVENOUS | Status: AC
  Filled 2016-11-07: qty 100

## 2016-11-07 MED ORDER — FENTANYL CITRATE (PF) 100 MCG/2ML IJ SOLN: 100.0000 ug | INTRAMUSCULAR | Status: DC | PRN

## 2016-11-07 MED ORDER — VANCOMYCIN HCL IN DEXTROSE 1-5 GM/200ML-% IV SOLN
1000.0000 mg | Freq: Once | INTRAVENOUS | Status: AC
  Administered 2016-11-07: 1000 mg via INTRAVENOUS
  Filled 2016-11-07: qty 200

## 2016-11-07 MED ORDER — HEPARIN SODIUM (PORCINE) 5000 UNIT/ML IJ SOLN: 5000.0000 [IU] | Freq: Three times a day (TID) | INTRAMUSCULAR | Status: DC

## 2016-11-07 MED ORDER — PROPOFOL 1000 MG/100ML IV EMUL
INTRAVENOUS | Status: AC
  Filled 2016-11-07: qty 100

## 2016-11-07 MED ORDER — ASPIRIN 300 MG RE SUPP
300.0000 mg | RECTAL | Status: AC
  Administered 2016-11-07: 300 mg via RECTAL
  Filled 2016-11-07: qty 1

## 2016-11-07 MED ORDER — DEXTROSE 5 % IV SOLN: 0.0000 ug/min | INTRAVENOUS | Status: DC

## 2016-11-07 MED ORDER — DEXTROSE 5 % IV SOLN
0.0000 ug/min | Freq: Once | INTRAVENOUS | Status: DC
  Filled 2016-11-07: qty 4

## 2016-11-07 MED ORDER — MIDAZOLAM HCL 2 MG/2ML IJ SOLN: 2.0000 mg | INTRAMUSCULAR | Status: DC | PRN

## 2016-11-07 MED ORDER — PIPERACILLIN-TAZOBACTAM 3.375 G IVPB 30 MIN
3.3750 g | Freq: Once | INTRAVENOUS | Status: AC
  Administered 2016-11-07: 3.375 g via INTRAVENOUS
  Filled 2016-11-07: qty 50

## 2016-11-07 MED ORDER — PROPOFOL 1000 MG/100ML IV EMUL
0.0000 ug/kg/min | INTRAVENOUS | Status: DC
Start: 2016-11-07 — End: 2016-11-07

## 2016-11-07 MED ORDER — EPINEPHRINE PF 1 MG/10ML IJ SOSY
PREFILLED_SYRINGE | INTRAMUSCULAR | Status: AC
  Filled 2016-11-07: qty 10

## 2016-11-07 MED ORDER — FENTANYL CITRATE (PF) 100 MCG/2ML IJ SOLN
50.0000 ug | Freq: Once | INTRAMUSCULAR | Status: AC
  Administered 2016-11-07: 50 ug via INTRAVENOUS

## 2016-11-07 MED ORDER — VASOPRESSIN 20 UNIT/ML IV SOLN
0.0400 [IU]/min | INTRAVENOUS | Status: DC
  Administered 2016-11-07: 0.03 [IU]/min via INTRAVENOUS
  Filled 2016-11-07: qty 2

## 2016-11-07 MED ORDER — SODIUM BICARBONATE 8.4 % IV SOLN
100.0000 meq | Freq: Once | INTRAVENOUS | Status: AC
  Administered 2016-11-07: 100 meq via INTRAVENOUS

## 2016-11-07 MED ORDER — LEVOFLOXACIN IN D5W 750 MG/150ML IV SOLN: 750.0000 mg | Freq: Once | INTRAVENOUS | Status: DC

## 2016-11-07 MED ORDER — SODIUM CHLORIDE 0.9 % IV SOLN
8.0000 mg/h | INTRAVENOUS | Status: DC
Start: 2016-11-07 — End: 2016-11-07
  Administered 2016-11-07: 8 mg/h via INTRAVENOUS
  Filled 2016-11-07 (×2): qty 80

## 2016-11-07 MED ORDER — FENTANYL CITRATE (PF) 100 MCG/2ML IJ SOLN
INTRAMUSCULAR | Status: AC
  Administered 2016-11-07: 50 ug via INTRAVENOUS
  Filled 2016-11-07: qty 2

## 2016-11-07 MED ORDER — MIDAZOLAM HCL 2 MG/2ML IJ SOLN
1.0000 mg | INTRAMUSCULAR | Status: DC | PRN
  Filled 2016-11-07: qty 2

## 2016-11-07 MED ORDER — CHLORHEXIDINE GLUCONATE 0.12% ORAL RINSE (MEDLINE KIT)
15.0000 mL | Freq: Two times a day (BID) | OROMUCOSAL | Status: DC
  Administered 2016-11-07: 15 mL via OROMUCOSAL

## 2016-11-07 MED ORDER — SUCCINYLCHOLINE CHLORIDE 20 MG/ML IJ SOLN
150.0000 mg | Freq: Once | INTRAMUSCULAR | Status: AC
  Administered 2016-11-07: 150 mg via INTRAVENOUS

## 2016-11-07 MED ORDER — ORAL CARE MOUTH RINSE: 15.0000 mL | OROMUCOSAL | Status: DC

## 2016-11-07 MED ORDER — PANTOPRAZOLE SODIUM 40 MG IV SOLR: 40.0000 mg | Freq: Every day | INTRAVENOUS | Status: DC

## 2016-11-07 MED ORDER — SODIUM BICARBONATE 8.4 % IV SOLN: 50.0000 meq | Freq: Once | INTRAVENOUS | Status: AC

## 2016-11-08 LAB — BPAM RBC
BLOOD PRODUCT EXPIRATION DATE: 201809272359
Blood Product Expiration Date: 201809252359
ISSUE DATE / TIME: 201809180540
ISSUE DATE / TIME: 201809180540
Unit Type and Rh: 9500
Unit Type and Rh: 9500

## 2016-11-08 LAB — TYPE AND SCREEN
ABO/RH(D): O POS
ANTIBODY SCREEN: NEGATIVE
UNIT DIVISION: 0
Unit division: 0

## 2016-11-09 LAB — CULTURE, BLOOD (ROUTINE X 2): SPECIAL REQUESTS: ADEQUATE

## 2016-11-12 LAB — CULTURE, BLOOD (ROUTINE X 2)
CULTURE: NO GROWTH
SPECIAL REQUESTS: ADEQUATE

## 2016-11-16 ENCOUNTER — Telehealth: Payer: Self-pay

## 2016-11-16 NOTE — Telephone Encounter (Signed)
On 11/16/16 I received a death certificate from [facility]. The d/c is for burial. The patient is a patient of Doctor Sood. The d/c will be taken to [facility] this pm for signature. When d/c comes back need to call [facility] as they will be the funeral home to pickup the d/c.  On 11/20/16 I received the death certificate back from Doctor Iola. I got the d/c ready and called the funeral home to let them know the d/c is ready for pickup.

## 2016-11-20 NOTE — Progress Notes (Signed)
[facility] Physician-Brief Progress Note Patient Name: Megan Roberts DOB: January 08, 1936 MRN: [id number]   Date of Service  10/30/2016  HPI/Events of Note  Notified by bedside RN Of acute worsening in blood pressure. Patient now having some bright red blood per rectum. Protonix drip currently running. Stat INR pending. Has peripheral IV access.   eICU Interventions  1. Stat 1 L normal saline bolus 2. Continuing Protonix drip 3. Reordering all labs stat 4. Stat transfusion 2 units packed red blood cells      Intervention Category Major Interventions: Hypotension - evaluation and management;Hemorrhage - evaluation and management  Lawanda Cousins 11/15/2016, 2:54 AM

## 2016-11-20 NOTE — ED Provider Notes (Signed)
[facility] DEPT Provider Note   CSN: [id number] Arrival date & time: 10/21/2016  2357     History   Chief Complaint Chief Complaint  Patient presents with  . Cardiac Arrest    HPI Megan Roberts is a 81 y.o. female.  Patient presents post cardiac arrest. Patient called for respiratory distress and had a witnessed arrest in front of fire department. She went into PEA rhythm. She received 2 doses of epinephrine and approximately 10 minutes of chest compressions. No shocks. King airway was placed and patient was transported. Unknown initial pulse ox. No known pulmonary cardiac history. History of hypertension. She arrives unresponsive with pinpoint pupils.   The history is provided by the patient and the EMS personnel. The history is limited by the condition of the patient.  Cardiac Arrest    Past Medical History:  Diagnosis Date  . Arthritis   . HOH (hard of hearing)   . Hypercholesteremia   . Hypertension   . Obesity     There are no active problems to display for this patient.   Past Surgical History:  Procedure Laterality Date  . ABDOMINAL HYSTERECTOMY    . CATARACT EXTRACTION W/PHACO Right 08/04/2016   Procedure: CATARACT EXTRACTION PHACO AND INTRAOCULAR LENS PLACEMENT RIGHT EYE;  Surgeon: Baruch Goldmann, MD;  Location: [facility];  Service: Ophthalmology;  Laterality: Right;  CDE: 20.23    OB History    No data available       Home Medications    Prior to Admission medications   Medication Sig Start Date End Date Taking? Authorizing Provider  acetaminophen (TYLENOL) 650 MG CR tablet Take 1,300 mg by mouth daily.    [provider]  aspirin EC 81 MG tablet Take 81 mg by mouth at bedtime.    [provider]  enalapril-hydrochlorothiazide (VASERETIC) 10-25 MG tablet Take 1 tablet by mouth daily with breakfast. 05/09/16   [provider]  ENSURE PLUS (ENSURE PLUS) LIQD Take 237 mLs by mouth daily as needed (when not eating).    [provider]  pravastatin (PRAVACHOL) 40 MG tablet Take 40 mg by mouth every evening.  05/18/16   [provider]  Vitamin D, Ergocalciferol, (DRISDOL) 50000 units CAPS capsule Take 50,000 Units by mouth every 7 (seven) days. Sundays 07/04/16   [provider]    Family History No family history on file.  Social History Social History  Substance Use Topics  . Smoking status: Former Smoker    Packs/day: 0.25    Years: 2.00    Types: Cigarettes    Quit date: 07/28/1972  . Smokeless tobacco: Never Used  . Alcohol use No     Allergies   Patient has no known allergies.   Review of Systems Review of Systems  Unable to perform ROS: Acuity of condition     Physical Exam Updated Vital Signs BP (!) 154/67 (BP Location: Left Arm)   Pulse (!) 112   Temp 97.9 F (36.6 C) (Temporal)   Resp 16   SpO2 99%   Physical Exam  Constitutional: She appears well-developed and well-nourished. No distress.  Obese, unresponsive  HENT:  Head: Normocephalic and atraumatic.  Mouth/Throat: Oropharynx is clear and moist. No oropharyngeal exudate.  Eyes: Pupils are equal, round, and reactive to light. Conjunctivae and EOM are normal.  Pinpoint pupils  Neck: Normal range of motion. Neck supple.  No meningismus.  Cardiovascular: Normal rate, regular rhythm, normal heart sounds and intact distal pulses.   No  murmur heard. Pulmonary/Chest: Effort normal and breath sounds normal. No respiratory distress. She exhibits no tenderness.  Rhonchi bilaterally with bagging  Abdominal: Soft. There is no tenderness. There is no rebound and no guarding.  Musculoskeletal: Normal range of motion. She exhibits no edema or tenderness.  Neurological: She is alert.  Unresponsive, no spontaneous movement  Skin: Skin is warm. Capillary refill takes less than 2 seconds.  Psychiatric: She has a normal mood and affect. Her behavior is normal.  Nursing note and vitals reviewed.    ED Treatments /  Results  Labs (all labs ordered are listed, but only abnormal results are displayed) Labs Reviewed  APTT - Abnormal; Notable for the following:       Result Value   aPTT 134 (*)    All other components within normal limits  LACTIC ACID, PLASMA - Abnormal; Notable for the following:    Lactic Acid, Venous 8.0 (*)    All other components within normal limits  BLOOD GAS, ARTERIAL - Abnormal; Notable for the following:    pH, Arterial 7.139 (*)    Bicarbonate 15.1 (*)    Acid-base deficit 12.5 (*)    All other components within normal limits  CBC WITH DIFFERENTIAL/PLATELET - Abnormal; Notable for the following:    WBC 17.1 (*)    RBC 3.51 (*)    Hemoglobin 8.9 (*)    HCT 28.9 (*)    MCH 25.4 (*)    Platelets 120 (*)    Neutro Abs 11.8 (*)    Monocytes Absolute 1.4 (*)    All other components within normal limits  BRAIN NATRIURETIC PEPTIDE - Abnormal; Notable for the following:    B Natriuretic Peptide 571.7 (*)    All other components within normal limits  COMPREHENSIVE METABOLIC PANEL - Abnormal; Notable for the following:    Chloride 115 (*)    CO2 16 (*)    Glucose, Bld 164 (*)    Creatinine, Ser 2.16 (*)    Calcium 7.1 (*)    Total Protein 4.0 (*)    Albumin 2.0 (*)    AST 478 (*)    ALT 211 (*)    GFR calc non Af Amer 21 (*)    GFR calc Af Amer 24 (*)    All other components within normal limits  D-DIMER, QUANTITATIVE (NOT AT [facility]) - Abnormal; Notable for the following:    D-Dimer, Quant >20.00 (*)    All other components within normal limits  PHOSPHORUS - Abnormal; Notable for the following:    Phosphorus 8.2 (*)    All other components within normal limits  PROTIME-INR - Abnormal; Notable for the following:    Prothrombin Time 38.5 (*)    All other components within normal limits  CBG MONITORING, ED - Abnormal; Notable for the following:    Glucose-Capillary 276 (*)    All other components within normal limits  I-STAT ARTERIAL BLOOD GAS, ED - Abnormal; Notable  for the following:    pH, Arterial 7.062 (*)    pCO2 arterial 69.4 (*)    pO2, Arterial 109.0 (*)    Bicarbonate 19.8 (*)    Acid-base deficit 11.0 (*)    All other components within normal limits  I-STAT CG4 LACTIC ACID, ED - Abnormal; Notable for the following:    Lactic Acid, Venous 10.95 (*)    All other components within normal limits  I-STAT CHEM 8, ED - Abnormal; Notable for the following:    BUN 23 (*)  Creatinine, Ser 1.40 (*)    Glucose, Bld 283 (*)    Calcium, Ion 1.09 (*)    TCO2 18 (*)    All other components within normal limits  I-STAT TROPONIN, ED - Abnormal; Notable for the following:    Troponin i, poc 0.41 (*)    All other components within normal limits  POC OCCULT BLOOD, ED - Abnormal; Notable for the following:    Fecal Occult Bld POSITIVE (*)    All other components within normal limits  POCT I-STAT 3, ART BLOOD GAS (G3+) - Abnormal; Notable for the following:    pH, Arterial 6.997 (*)    pCO2 arterial 51.9 (*)    Bicarbonate 12.9 (*)    TCO2 15 (*)    Acid-base deficit 18.0 (*)    All other components within normal limits  CULTURE, BLOOD (ROUTINE X 2)  URINE CULTURE  CULTURE, RESPIRATORY (NON-EXPECTORATED)  MRSA PCR SCREENING  CULTURE, BLOOD (ROUTINE X 2)  CULTURE, BLOOD (ROUTINE X 2)  MAGNESIUM  URINALYSIS, ROUTINE W REFLEX MICROSCOPIC  HEMOGLOBIN A1C  CBC  CBC  LACTIC ACID, PLASMA  I-STAT CG4 LACTIC ACID, ED  I-STAT TROPONIN, ED  CBG MONITORING, ED  TYPE AND SCREEN  ABO/RH  PREPARE RBC (CROSSMATCH)  TYPE AND SCREEN    EKG  EKG Interpretation  Date/Time:  Nov 08, 2016 00:02:24 EDT Ventricular Rate:  120 PR Interval:    QRS Duration: 146 QT Interval:  444 QTC Calculation: 628 R Axis:   61 Text Interpretation:  Sinus or ectopic atrial tachycardia Right bundle branch block new RBBB no acute ST changes  Confirmed by Ezequiel Essex [phone]) on 2016/11/08 12:19:05 AM       Radiology Ct Head Wo Contrast  Result  Date: 08-Nov-2016 CLINICAL DATA:  Respiratory distress, unresponsive patient EXAM: CT HEAD WITHOUT CONTRAST TECHNIQUE: Contiguous axial images were obtained from the base of the skull through the vertex without intravenous contrast. COMPARISON:  None. FINDINGS: Brain: No acute territorial infarction, hemorrhage, or intracranial mass is seen. Mild hypodensity in the periventricular white matter consistent with small vessel ischemic changes. Mild atrophy. Normal ventricle size. Vascular: No hyperdense vessels.  No unexpected calcification. Skull: No skull fracture. Soft tissue within the external auditory canals bilaterally. Sinuses/Orbits: No acute finding. Other: None IMPRESSION: No definite CT evidence for acute intracranial abnormality. Mild atrophy and small vessel ischemic changes of the white matter Electronically Signed   By: Donavan Foil M.D.   On: 2016-11-08 03:51   Dg Chest Port 1 View  Result Date: 11/08/2016 CLINICAL DATA:  Central venous catheter.  Endotracheal tube EXAM: PORTABLE CHEST 1 VIEW COMPARISON:  2016/11/08 FINDINGS: Interval placement of right jugular central venous catheter with the tip in the SVC. No pneumothorax. Endotracheal tube is low and may be within the proximal right bronchus. Recommend withdrawal 4 cm. NG tube enters the stomach. Right upper lobe airspace disease demonstrate progressive consolidation. Hypoventilation with bibasilar atelectasis IMPRESSION: Central line tip in the SVC Endotracheal tube in the proximal right bronchus, recommend withdrawal 4 cm Progressive consolidation right lower lobe Progression of bibasilar atelectasis. These results will be called to the ordering clinician or representative by the Radiologist Assistant, and communication documented in the PACS or zVision Dashboard. Electronically Signed   By: Franchot Gallo M.D.   On: 11/08/2016 07:12   Dg Chest Portable 1 View  Result Date: 11/08/2016 CLINICAL DATA:  Post CPR EXAM: PORTABLE CHEST 1 VIEW  COMPARISON:  Report 12/25/2010 FINDINGS: Endotracheal tube tip  is just above the right mainstem bronchus. Esophageal tube tip extends below diaphragm but is not included on the image. Right upper lobe infiltrate. Mild cardiomegaly. Aortic atherosclerosis. No pneumothorax. IMPRESSION: 1. Endotracheal tube tip appears to be just above the right mainstem bronchus 2. Right upper lobe infiltrate 3. Borderline cardiomegaly with central vascular congestion Electronically Signed   By: Donavan Foil M.D.   On: Nov 25, 2016 00:26    Procedures Procedure Name: Intubation Date/Time: November 25, 2016 12:24 AM Performed by: Ezequiel Essex Pre-anesthesia Checklist: Patient identified, Emergency Drugs available, Suction available and Patient being monitored Oxygen Delivery Method: Ambu bag Preoxygenation: Pre-oxygenation with 100% oxygen Induction Type: IV induction and Rapid sequence Ventilation: Mask ventilation without difficulty Laryngoscope Size: Mac and 4 Grade View: Grade II Tube type: Subglottic suction tube Tube size: 7.5 mm Number of attempts: 1 Airway Equipment and Method: Bougie stylet and Video-laryngoscopy Placement Confirmation: ETT inserted through vocal cords under direct vision,  Positive ETCO2 and Breath sounds checked- equal and bilateral Secured at: 24 cm Tube secured with: ETT holder Dental Injury: Teeth and Oropharynx as per pre-operative assessment  Difficulty Due To: Difficult Airway- due to large tongue and Difficulty was anticipated Future Recommendations: Recommend- induction with short-acting agent, and alternative techniques readily available      (including critical care time)  Medications Ordered in ED Medications  propofol (DIPRIVAN) 1000 MG/100ML infusion (not administered)  fentaNYL (SUBLIMAZE) injection 50 mcg (not administered)  fentaNYL 2566mg in NS 2522m(1073mml) infusion-PREMIX (not administered)  fentaNYL (SUBLIMAZE) bolus via infusion 25 mcg (not administered)   aspirin suppository 300 mg (not administered)  sodium chloride 0.9 % bolus 1,000 mL (not administered)  etomidate (AMIDATE) injection 20 mg (20 mg Intravenous Given 9/110-07-1805)  succinylcholine (ANECTINE) injection 150 mg (150 mg Intravenous Given 9/110/06/201808)     Initial Impression / Assessment and Plan / ED Course  I have reviewed the triage vital signs and the nursing notes.  Pertinent labs & imaging results that were available during my care of the patient were reviewed by me and considered in my medical decision making (see chart for details).     Patient status post respiratory arrest leading to cardiac arrest and PEA. No known cardiac or pulmonary history. EKG shows sinus tachycardia with right bundle branch block.  Patient started on cooling protocol with ice packs. No STEMI on EKG.  Labs obtained. Patient intubated as above. King tube removed. Lactate level elevated. Blood cultures obtained and broad spectrum antibiotics started.   Concern for possible pulmonary embolism. X-ray shows right upper lobe infiltrate which may be infarct as well. Patient treated for sepsis with IV fluids and antibiotics. Code sepsis protocol initiated. Patient also started on IV heparin. CT head negative  Patient covered with IV fluids and IV antibiotics for possible pneumonia. Pulmonary embolus and also considered given her positive troponin and respiratory arrest. CT angiogram ordered.  Discussed with critical care bedside and they wish to hold CT angiogram at this point awaiting improvement in her creatinine. Patient also with evidence of bright red blood per rectum. Heparin Will be held. Metabolic acidosis on labs, bicarb gtt startedPatient to be admitted to ICU in critical condition No family present.   CRITICAL CARE Performed by: RANEzequiel Essextal critical care time: 60 minutes Critical care time was exclusive of separately billable procedures and treating other patients. Critical  care was necessary to treat or prevent imminent or life-threatening deterioration. Critical care was time spent personally by me on the following activities: development  of treatment plan with patient and/or surrogate as well as nursing, discussions with consultants, evaluation of patient's response to treatment, examination of patient, obtaining history from patient or surrogate, ordering and performing treatments and interventions, ordering and review of laboratory studies, ordering and review of radiographic studies, pulse oximetry and re-evaluation of patient's condition.   Final Clinical Impressions(s) / ED Diagnoses   Final diagnoses:  Cardiac arrest ([place])  Sepsis, due to unspecified organism [facility])  Community acquired pneumonia of right upper lobe of lung [facility])    New Prescriptions New Prescriptions   No medications on file     Ezequiel Essex, MD 2016-11-13 [phone]

## 2016-11-20 NOTE — Progress Notes (Signed)
Gave 0.1 mg of epinephrine push per Dr. Carlota Raspberry.

## 2016-11-20 NOTE — Progress Notes (Signed)
   11-09-2016 0900  Clinical Encounter Type  Visited With Other (Comment) (answered pager-family gone for about 30 minutes)  Visit Type Death (answered page for chqaplain )  Referral From Nurse  Recommendations Return  after 30 minutes if possible when family returns  Arrived to be with family who has left for about 30 minutes. They have called their church pastor to come.  Will try to stop by if able after 30 minutes. ,mec

## 2016-11-20 NOTE — Procedures (Signed)
Central Venous Catheter Insertion Procedure Note ZAHRA PEFFLEY [id number] 1935-08-28  Procedure: Insertion of Central Venous Catheter Indications: Assessment of intravascular volume, Drug and/or fluid administration and Frequent blood sampling  Procedure Details Consent: Risks of procedure as well as the alternatives and risks of each were explained to the (patient/caregiver).  Consent for procedure obtained. Time Out: Verified patient identification, verified procedure, site/side was marked, verified correct patient position, special equipment/implants available, medications/allergies/relevent history reviewed, required imaging and test results available.  Performed  Maximum sterile technique was used including antiseptics, cap, gloves, gown, hand hygiene, mask and sheet. Skin prep: Chlorhexidine; local anesthetic administered A antimicrobial bonded/coated triple lumen catheter was placed in the right internal jugular vein using the Seldinger technique.  Evaluation Blood flow good Complications: No apparent complications Patient did tolerate procedure well. Chest X-ray ordered to verify placement.  CXR: pending.  Gypsy Balsam Raeden Belzer Nov 23, 2016, 5:50 AM

## 2016-11-20 NOTE — Progress Notes (Signed)
Spoke with medical examiner Tim, and he stated that the pt was no longer a ME case.  All tubes & drains can be removed.  Emotional support given to family at bedside.

## 2016-11-20 NOTE — Progress Notes (Addendum)
Late entry- Came to room for ABG and to pull back ETT 2 cm per MD orders.  When I arrived to room, RN states pt just deceased- confirmed by 2 RN time of death 0906.  Ventilator turned off, but ETT left in place d/t pt possible ME case.  RN in room and aware.

## 2016-11-20 NOTE — ED Notes (Signed)
Ice packs applied at groin and bilateral axilla .

## 2016-11-20 NOTE — Procedures (Signed)
Arterial Catheter Insertion Procedure Note Megan Roberts [id number] Sep 13, 1935  Procedure: Insertion of Arterial Catheter  Indications: Blood pressure monitoring and Frequent blood sampling  Procedure Details Consent: Risks of procedure as well as the alternatives and risks of each were explained to the (patient/caregiver).  Consent for procedure obtained. Time Out: Verified patient identification, verified procedure, site/side was marked, verified correct patient position, special equipment/implants available, medications/allergies/relevent history reviewed, required imaging and test results available.  Performed  Maximum sterile technique was used including antiseptics, cap, gloves, gown, hand hygiene, mask and sheet. Skin prep: Chlorhexidine; local anesthetic administered 20 gauge catheter was inserted into right femoral artery using the Seldinger technique.  Evaluation Blood flow good; BP tracing good. Complications: No apparent complications.   Jovita Kussmaul, AGACNP-BC [facility]  Pgr: [phone]  PCCM Pgr: [phone]  I, Dr Newell Coral Agree with the above note by NP [place] Pt required femoral arterial line for hemodynamic monitoring. Consent was obtained, Time out performed and maximal barrier precautions with sterile technique used for line insertion. No complications occurred. I was personally present for procedure Good wave form on transducer and good blood return  Dr. Newell Coral [facility]

## 2016-11-20 NOTE — ED Notes (Signed)
Transported to CT scan

## 2016-11-20 NOTE — Progress Notes (Signed)
   10/26/2016 0900  Clinical Encounter Type  Visited With Other (Comment) (answered pager-family gone for about 30 minutes)  Visit Type Death (answered page for chqaplain )  Referral From Nurse  Recommendations Return  after 30 minutes if possible when family returns  Family not available -staff says pastor is coming to be with them. Phebe Colla, Chaplain

## 2016-11-20 NOTE — Progress Notes (Addendum)
Emergent blood release [id number] Type O- Exp: 11/14/2016 Time 2359 started at 0555  Second unit of blood [id number] Type: O- Exp: 11/16/16 Time 2359  Started at 0600

## 2016-11-20 NOTE — Progress Notes (Signed)
Changed pts rate on vent to 20 due to high C02 verified by ABG.

## 2016-11-20 NOTE — Progress Notes (Signed)
Witnessed emergent release blood with Hillery Jacks, RN. Patient identified with unit #, blood type, expiration date/time. No adverse reactions seen. MD at bedside upon administering blood. Will continue to monitor patient.  Horton Chin, RN

## 2016-11-20 NOTE — Death Summary Note (Signed)
Megan Roberts was a 81 y.o. female admitted on Nov 22, 2016 after developing respiratory distress and then witnessed cardiac arrest.  She was found to be in PEA.  She had ROSC after about 15 to 20 minutes.  She was found to have pneumonia.  She was intubated and started on antibiotics.  She was started on heparin gtt.  She developed bright red blood per rectum and heparin gtt was stopped.  She was started on protonix gtt for possible upper GI source.  GI was consulted.  She was set up for blood transfusion.  Family arrived and updated about pt's status.  They decided for DNR status with no escalation of care.  She developed refractory acidosis.  Her medical status continued to deteriorate and she expired on 10/22/2016 at 9:06 AM.  Final diagnoses: Community acquired pneumonia Acute hypoxia respiratory failure PEA cardiac arrest Acute renal failure with ATN Metabolic acidosis Lactic acidosis NSTEMI Acute anoxic encephalopathy Hyperglycemia GI bleeding likely from ischemic colitis Morbid obesity with BMI 48.54 Septic shock Cardiogenic shock Elevated LFTs from shock liver DIC   Coralyn Helling, MD [facility]/Critical Care 11/16/2016, 8:21 AM

## 2016-11-20 NOTE — Consult Note (Signed)
Referring Provider:  [facility]  Primary Care Physician:  Dione Housekeeper, MD Primary Gastroenterologist:  Althia Forts  Reason for Consultation:  Rectal bleeding  HPI: Megan Roberts is a 81 y.o. female with past medical history only for hypertension and obesity was brought into ED by EMS for cardiac arrest. Patient received CPR while in the ER and was subsequently intubated. Patient subsequently was noted to have GI bleed with blood in the OG-tube as well as rectal bleeding. GI is consulted for further evaluation.  Patient seen and examined at bedside in ICU. Critical-care attending at bedside . Patient appears to be critically ill with the blood pressure of only 40/18. She remains intubated. Evidence of fresh blood in the OG noted.    Past Medical History:  Diagnosis Date  . Arthritis   . HOH (hard of hearing)   . Hypercholesteremia   . Hypertension   . Obesity     Past Surgical History:  Procedure Laterality Date  . ABDOMINAL HYSTERECTOMY    . CATARACT EXTRACTION W/PHACO Right 08/04/2016   Procedure: CATARACT EXTRACTION PHACO AND INTRAOCULAR LENS PLACEMENT RIGHT EYE;  Surgeon: Baruch Goldmann, MD;  Location: [facility];  Service: Ophthalmology;  Laterality: Right;  CDE: 20.23    Prior to Admission medications   Medication Sig Start Date End Date Taking? Authorizing Provider  acetaminophen (TYLENOL) 650 MG CR tablet Take 1,300 mg by mouth daily.    [provider]  aspirin EC 81 MG tablet Take 81 mg by mouth at bedtime.    [provider]  enalapril-hydrochlorothiazide (VASERETIC) 10-25 MG tablet Take 1 tablet by mouth daily with breakfast. 05/09/16   [provider]  ENSURE PLUS (ENSURE PLUS) LIQD Take 237 mLs by mouth daily as needed (when not eating).    [provider]  pravastatin (PRAVACHOL) 40 MG tablet Take 40 mg by mouth every evening.  05/18/16   [provider]  Vitamin D, Ergocalciferol, (DRISDOL) 50000 units CAPS capsule Take 50,000  Units by mouth every 7 (seven) days. Sundays 07/04/16   [provider]    Scheduled Meds: . chlorhexidine gluconate (MEDLINE KIT)  15 mL Mouth Rinse BID  . fentaNYL (SUBLIMAZE) injection  50 mcg Intravenous Once  . hydrocortisone sod succinate (SOLU-CORTEF) inj  100 mg Intravenous Q8H  . insulin aspart  2-6 Units Subcutaneous Q4H  . iopamidol      . mouth rinse  15 mL Mouth Rinse 10 times per day  . [START ON 11/10/2016] pantoprazole  40 mg Intravenous Q12H   Continuous Infusions: . sodium chloride    . sodium chloride    . fentaNYL infusion INTRAVENOUS Stopped (12/06/16 0400)  . levofloxacin (LEVAQUIN) IV    . [START ON 11/09/2016] levofloxacin (LEVAQUIN) IV    . norepinephrine (LEVOPHED) Adult infusion 40 mcg/min (12/06/2016 0723)  . pantoprozole (PROTONIX) infusion 8 mg/hr (December 06, 2016 0723)  . piperacillin-tazobactam (ZOSYN)  IV    .  sodium bicarbonate (isotonic) infusion in sterile water 150 mL/hr at December 06, 2016 0723  . sodium chloride    . vancomycin Stopped (12-06-2016 0748)  . vasopressin (PITRESSIN) infusion - *FOR SHOCK* 0.04 Units/min (Dec 06, 2016 0723)   PRN Meds:.sodium chloride, fentaNYL, midazolam, midazolam, midazolam  Allergies as of 11/04/2016  . (No Known Allergies)    No family history on file.  Social History   Social History  . Marital status: Widowed    Spouse name: N/A  . Number of children: N/A  . Years of education: N/A   Occupational History  .  Not on file.   Social History Main Topics  . Smoking status: Former Smoker    Packs/day: 0.25    Years: 2.00    Types: Cigarettes    Quit date: 07/28/1972  . Smokeless tobacco: Never Used  . Alcohol use No  . Drug use: No  . Sexual activity: Not Currently    Birth control/ protection: None   Other Topics Concern  . Not on file   Social History Narrative  . No narrative on file    Review of Systems: Not able to obtain  Physical Exam: Vital signs: Vitals:   11/14/2016 0723 November 14, 2016 0750  BP:   (!) 60/29  Pulse: 84 84  Resp: 20 (!) 32  Temp: (!) 95.2 F (35.1 C)   SpO2: 91% (!) 81%   Last BM Date: 2016-11-14 General:  Intubated Lungs:  Coarse breath sounds Abdomen: Abdomen distended, very faint bowel sounds. Not able to appreciate tenderness   GI:  Lab Results:  Recent Labs  14-Nov-2016 0033 2016/11/14 0420  WBC  --  17.1*  HGB 13.9 8.9*  HCT 41.0 28.9*  PLT  --  120*   BMET  Recent Labs  11-14-16 0033 Nov 14, 2016 0420  NA 138 140  K 4.3 4.4  CL 105 115*  CO2  --  16*  GLUCOSE 283* 164*  BUN 23* 18  CREATININE 1.40* 2.16*  CALCIUM  --  7.1*   LFT  Recent Labs  11/14/16 0420  PROT 4.0*  ALBUMIN 2.0*  AST 478*  ALT 211*  ALKPHOS 90  BILITOT 0.9   PT/INR  Recent Labs  2016/11/14 0420  LABPROT 38.5*  INR 3.97     Studies/Results: Ct Head Wo Contrast  Result Date: Nov 14, 2016 CLINICAL DATA:  Respiratory distress, unresponsive patient EXAM: CT HEAD WITHOUT CONTRAST TECHNIQUE: Contiguous axial images were obtained from the base of the skull through the vertex without intravenous contrast. COMPARISON:  None. FINDINGS: Brain: No acute territorial infarction, hemorrhage, or intracranial mass is seen. Mild hypodensity in the periventricular white matter consistent with small vessel ischemic changes. Mild atrophy. Normal ventricle size. Vascular: No hyperdense vessels.  No unexpected calcification. Skull: No skull fracture. Soft tissue within the external auditory canals bilaterally. Sinuses/Orbits: No acute finding. Other: None IMPRESSION: No definite CT evidence for acute intracranial abnormality. Mild atrophy and small vessel ischemic changes of the white matter Electronically Signed   By: Donavan Foil M.D.   On: 11-14-16 03:51   Dg Chest Port 1 View  Result Date: 14-Nov-2016 CLINICAL DATA:  Central venous catheter.  Endotracheal tube EXAM: PORTABLE CHEST 1 VIEW COMPARISON:  11-14-16 FINDINGS: Interval placement of right jugular central venous catheter  with the tip in the SVC. No pneumothorax. Endotracheal tube is low and may be within the proximal right bronchus. Recommend withdrawal 4 cm. NG tube enters the stomach. Right upper lobe airspace disease demonstrate progressive consolidation. Hypoventilation with bibasilar atelectasis IMPRESSION: Central line tip in the SVC Endotracheal tube in the proximal right bronchus, recommend withdrawal 4 cm Progressive consolidation right lower lobe Progression of bibasilar atelectasis. These results will be called to the ordering clinician or representative by the Radiologist Assistant, and communication documented in the PACS or zVision Dashboard. Electronically Signed   By: Franchot Gallo M.D.   On: 2016/11/14 07:12   Dg Chest Portable 1 View  Result Date: 2016/11/14 CLINICAL DATA:  Post CPR EXAM: PORTABLE CHEST 1 VIEW COMPARISON:  Report 12/25/2010 FINDINGS: Endotracheal tube tip is just above the right mainstem  bronchus. Esophageal tube tip extends below diaphragm but is not included on the image. Right upper lobe infiltrate. Mild cardiomegaly. Aortic atherosclerosis. No pneumothorax. IMPRESSION: 1. Endotracheal tube tip appears to be just above the right mainstem bronchus 2. Right upper lobe infiltrate 3. Borderline cardiomegaly with central vascular congestion Electronically Signed   By: Donavan Foil M.D.   On: Nov 11, 2016 00:26    Impression/Plan: - GI bleed with fresh blood in the OG suction as well as rectal bleeding in patient with cardiac arrest and multiorgan failure. INR 3.97. She could have ischemic event affecting her small intestine along with ischemic colitis. Discussed with the family at bedside. Also discussed with critical care attending. Patient is critically ill with blood pressure of around 40/18 while being on pressor support. Family does not want to escalate  care. Her prognosis is poor. GI will sign off. Call us back if needed.   LOS: 0 days   Otis Brace  MD, FACP 11/11/16, 8:21  AM  Pager [phone] If no answer or after 5 PM call [phone]

## 2016-11-20 NOTE — H&P (Signed)
PULMONARY / CRITICAL CARE MEDICINE   Name: MERANDA DECHAINE MRN: [id number] DOB: [birth year]    ADMISSION DATE:  11/10/2016 CONSULTATION DATE:  12/02/2016  REFERRING MD:  Dr. Manus Gunning   CHIEF COMPLAINT:  Cardiac Arrest   HISTORY OF PRESENT ILLNESS:   81 year old female with PMH of HTN and Obesity   Presents to ED on 9/17 post cardiac arrest. Patient called EMS for respiratory distress and had a witnessed arrest in front of fire. PEA for 10 minutes before ROSC. Upon arrival to ED patient intubated. Chest Xray with right upper lobe infiltrate. EKG with new right bundle branch block. Troponin 0.41. PCCM asked to admit.   Per family patient has had a dry cough that began today. Before calling 911 patient felt hot and short of breath.   PAST MEDICAL HISTORY :  She  has a past medical history of Arthritis; HOH (hard of hearing); Hypercholesteremia; Hypertension; and Obesity.  PAST SURGICAL HISTORY: She  has a past surgical history that includes Abdominal hysterectomy and Cataract extraction w/PHACO (Right, 08/04/2016).  No Known Allergies  No current facility-administered medications on file prior to encounter.    Current Outpatient Prescriptions on File Prior to Encounter  Medication Sig  . acetaminophen (TYLENOL) 650 MG CR tablet Take 1,300 mg by mouth daily.  Marland Kitchen aspirin EC 81 MG tablet Take 81 mg by mouth at bedtime.  . enalapril-hydrochlorothiazide (VASERETIC) 10-25 MG tablet Take 1 tablet by mouth daily with breakfast.  . ENSURE PLUS (ENSURE PLUS) LIQD Take 237 mLs by mouth daily as needed (when not eating).  . pravastatin (PRAVACHOL) 40 MG tablet Take 40 mg by mouth every evening.   . Vitamin D, Ergocalciferol, (DRISDOL) 50000 units CAPS capsule Take 50,000 Units by mouth every 7 (seven) days. Sundays    FAMILY HISTORY:  Her has no family status information on file.    SOCIAL HISTORY: She  reports that she quit smoking about 44 years ago. Her smoking use included Cigarettes. She  has a 0.50 pack-year smoking history. She has never used smokeless tobacco. She reports that she does not drink alcohol or use drugs.  REVIEW OF SYSTEMS:   Unable to review as patient is intubated and sedated   SUBJECTIVE:    VITAL SIGNS: BP (!) 154/67 (BP Location: Left Arm)   Pulse (!) 112   Temp 97.9 F (36.6 C) (Temporal)   Resp 16   Ht  (1.727 m)   Wt 124.7 kg (275 lb)   SpO2 99%   BMI 41.81 kg/m   HEMODYNAMICS:    VENTILATOR SETTINGS: Vent Mode: PRVC FiO2 (%):  [100 %] 100 % Set Rate:  [15 bmp] 15 bmp Vt Set:  [530 mL] 530 mL PEEP:  [5 cmH20] 5 cmH20  INTAKE / OUTPUT: No intake/output data recorded.  PHYSICAL EXAMINATION: General:  Adult female, no distress  Neuro:  +gag/cough, does not follow commands, withdrawals to pain  HEENT:  Blood noted in ETT tube  Cardiovascular:  Tachy, no MRG  Lungs:  Diminished breath sounds, no wheeze/crackles  Abdomen:  Obese, active bowel sounds  Musculoskeletal:  +1 pedal edema  Skin:  Warm, dry   LABS:  BMET  Recent Labs Lab 2016/12/02 0033  NA 138  K 4.3  CL 105  BUN 23*  CREATININE 1.40*  GLUCOSE 283*    Electrolytes No results for input(s): CALCIUM, MG, PHOS in the last 168 hours.  CBC  Recent Labs Lab 12/02/16 0033  HGB 13.9  HCT  41.0    Coag's No results for input(s): APTT, INR in the last 168 hours.  Sepsis Markers  Recent Labs Lab 11/03/2016 0034  LATICACIDVEN 10.95*    ABG No results for input(s): PHART, PCO2ART, PO2ART in the last 168 hours.  Liver Enzymes No results for input(s): AST, ALT, ALKPHOS, BILITOT, ALBUMIN in the last 168 hours.  Cardiac Enzymes No results for input(s): TROPONINI, PROBNP in the last 168 hours.  Glucose  Recent Labs Lab 11/06/2016 0004  GLUCAP 276*    Imaging Dg Chest Portable 1 View  Result Date: 11/10/2016 CLINICAL DATA:  Post CPR EXAM: PORTABLE CHEST 1 VIEW COMPARISON:  Report 12/25/2010 FINDINGS: Endotracheal tube tip is just above the  right mainstem bronchus. Esophageal tube tip extends below diaphragm but is not included on the image. Right upper lobe infiltrate. Mild cardiomegaly. Aortic atherosclerosis. No pneumothorax. IMPRESSION: 1. Endotracheal tube tip appears to be just above the right mainstem bronchus 2. Right upper lobe infiltrate 3. Borderline cardiomegaly with central vascular congestion Electronically Signed   By: Jasmine Pang M.D.   On: 10/27/2016 00:26     STUDIES:  CXR 9/18 > Endotracheal tube tip appears to be just above the right mainstem bronchus. Right upper lobe infiltrate. Borderline cardiomegaly with central vascular congestion  CULTURES: Blood 9/18 >> Urine 9/18 >> Sputum 9/18 >>   ANTIBIOTICS: Levaquin 9/18 >> Zosyn 9/18 >> Vancomycin 9/18 >>  SIGNIFICANT EVENTS: 9/18 > Presents to ED   LINES/TUBES: ETT 9/18 >>  DISCUSSION: 81 year old female presents respiratory failure leading to cardiac arrest. Intubated in ED.   ASSESSMENT / PLAN:  PULMONARY A: Acute Hypoxic Respiratory Failure in setting of PE? +/- CAP/Aspiration Events  P:   Vent Support ABG/CXR  PAD Protocol  Wells Criteria 7 > however will not start heparin due to active GI bleed   CARDIOVASCULAR A:  PEA Cardiac Arrest  New Right Bundle Branch Block  Elevated Troponin  H/O HTN  P:  Cardiac Monitoring  Maintain MAP >70 Place CVC  Trend Troponin  Hold Pravastatin and Vaseretic  Hypothermia deferred due to GI bleed   RENAL A:   AKI  Lactic Acidosis  P:   Trend BMP NS @ 125 ml/hr   GASTROINTESTINAL A:   Hemoccult positive  P:   NPO Consult GI  Protonix gtt  Trend CBC   HEMATOLOGIC A:   ?PE  P:  Trend CBC  Heparin as above   DVT/ECHO pending   INFECTIOUS A:   Aspiration Event vs CAP  P:   Trend WBC and fever Curve  Follow culture data  Antibiotics as above   ENDOCRINE A:   Hyperglycemia    P:   Trend Glucose  SSI  A1C pending   NEUROLOGIC A:   Acute Metabolic Encephalopathy  vs anoxic injury   P:   RASS goal: -1/-2 Fentanyl gtt  PRN Versed  EEG/CT Head pending   FAMILY  - Updates: Family updated at bedside   - Inter-disciplinary family meet or Palliative Care meeting due by: 11/14/2016    CC Time: 45 minutes   Jovita Kussmaul, AGACNP-BC [facility]  Pgr: [phone]  PCCM Pgr: [phone]  STAFF NOTE  I, Dr Newell Coral have personally reviewed patient's available data, including medical history, events of note, physical examination and test results as part of my evaluation. I have discussed with resident/NP and other care providers such as pharmacist, RN and RRT.  In addition,  I personally evaluated patient  81 yr old female with PMHx of HTN HLD Obesity that presents from home. Per daughter patient c/o cough, SOB and was diaphoretic, cold and clammy fanning herself her daughter called EMS. When EMS arrived patient was in recliner in her room, witnessed arrest. Compressions started immediately and Epinephrine given achieved ROSC in . Minimally responsive GCS 3-> Code Cool at 36 initiated.  RUL infiltrate on CXR, new RBBB, respiratory acidosis on ABG ph 7.062/69.4/109/22 Wells score 7 High risk for PE Creatinine elevated at 1.4-> given IVF-> repeat Creat pending If it improves CT PE study On repeat examination noted  large melena noted Hgb stable at 12-> stopped goal of 36 switched to Normothermia protocol Can't start heparin ggt due to GIB.  Noted elevated Troponin 0.4 and new RBBB most likely secondary to PE however can not exclude right sided MI. Needs TTE. Consulted Cardiology Discussed with consults Consults: Cardiology, Gastroenterology   Rest per NP whose note is outlined above and that I agree with. Prognosis Guarded Code status: full Disposition: ICU  The patient is critically ill with multiple organ systems failure and requires high complexity decision making for assessment and support, frequent  evaluation and titration of therapies, application of advanced monitoring technologies and extensive interpretation of multiple databases.  Critical Care Time devoted to patient care services described in this note is 40 Minutes. This time reflects time of care of this signee Dr Newell Coral. This critical care time does not reflect procedure time, or teaching time or supervisory time of NP but could involve care discussion time    Dr. Newell Coral Pulmonary Critical Care Medicine Locums  10/27/2016 2:31 AM

## 2016-11-20 NOTE — ED Triage Notes (Addendum)
Patient arrived with EMS from home witnessed arrest by family / firemen , she received 3 doses of Epinephrine for PEA , intubated at scene , IV at right ac 20g. Arrived tachycardic. CBG= 225.

## 2016-11-20 NOTE — H&P (Signed)
Pt time of death is 0906.  Pronounced by 2 RNs - Shearon Balo & Lynetta Mare by ausculation for 1 full minute with no heart tones heard.  Emotional support provided to family and chaplain offered.

## 2016-11-20 NOTE — ED Notes (Signed)
NS IV bolus infusing , admittting MD explained admission plan and plan of care to pt.'s family , ETT /OGt intact , Fentanyl drip infusing .

## 2016-11-20 NOTE — Progress Notes (Signed)
PULMONARY / CRITICAL CARE MEDICINE   Name: Megan Roberts MRN: [id number] DOB: 1935/05/27    ADMISSION DATE:  10/24/2016  HISTORY OF PRESENT ILLNESS:   81yoF with hx HTN and Morbid obesity, admitted overnight with out of hospital cardiac arrest, s/p ROSC, with acute hypoxic respiratory failure and aspiration pneumonia requiring mechanical ventilation, severe shock (septic vs cardiogenic), anoxic encephalopathy, AKI, Shock liver, and GIB. Overnight she was self-cooled to 35 degrees. Therapeutic hypothermia not started due to her significant bleeding. She was pancultured and started on antibiotics. Vasopressors started. This AM she continues to have significant BRBPR. SBP 42. Family (1 daughter and 1 close family friend) are at bedside and state that patient is DNR and that we should not escalate care. Specifically they asked me not to increase the levophed infusion rate. They are awaiting the patient's son who is driving here from [place]. Discussed patient's multiorgan system failure with the family and the expectation that she will likely die today unfortunately. They are understanding of this and our goal to keep her comfortable.   PAST MEDICAL HISTORY :  She  has a past medical history of Arthritis; HOH (hard of hearing); Hypercholesteremia; Hypertension; and Obesity.  PAST SURGICAL HISTORY: She  has a past surgical history that includes Abdominal hysterectomy and Cataract extraction w/PHACO (Right, 08/04/2016).  No Known Allergies  No current facility-administered medications on file prior to encounter.    Current Outpatient Prescriptions on File Prior to Encounter  Medication Sig  . acetaminophen (TYLENOL) 650 MG CR tablet Take 1,300 mg by mouth daily.  Marland Kitchen aspirin EC 81 MG tablet Take 81 mg by mouth at bedtime.  . enalapril-hydrochlorothiazide (VASERETIC) 10-25 MG tablet Take 1 tablet by mouth daily with breakfast.  . ENSURE PLUS (ENSURE PLUS) LIQD Take 237 mLs by mouth daily as needed (when  not eating).  . pravastatin (PRAVACHOL) 40 MG tablet Take 40 mg by mouth every evening.   . Vitamin D, Ergocalciferol, (DRISDOL) 50000 units CAPS capsule Take 50,000 Units by mouth every 7 (seven) days. Sundays    FAMILY HISTORY:  Her has no family status information on file.    SOCIAL HISTORY: She  reports that she quit smoking about 44 years ago. Her smoking use included Cigarettes. She has a 0.50 pack-year smoking history. She has never used smokeless tobacco. She reports that she does not drink alcohol or use drugs.  REVIEW OF SYSTEMS:   Review of Systems  Unable to perform ROS: Critical illness   VITAL SIGNS: BP (!) 60/29   Pulse (!) 137   Temp (!) 95.2 F (35.1 C)   Resp 14   Ht  (1.727 m)   Wt (!) 144.8 kg (319 lb 3.6 oz)   SpO2 (!) 57%   BMI 48.54 kg/m   HEMODYNAMICS: CVP:  [10 mmHg-34 mmHg] 14 mmHg  VENTILATOR SETTINGS: Vent Mode: PRVC FiO2 (%):  [100 %] 100 % Set Rate:  [15 bmp-32 bmp] 32 bmp Vt Set:  [510 mL-530 mL] 510 mL PEEP:  [5 cmH20] 5 cmH20  INTAKE / OUTPUT: I/O last 3 completed shifts: In: 3400.2 [I.V.:2550.2; IV Piggyback:850] Out: 200 [Emesis/NG output:200]  PHYSICAL EXAMINATION: General: Morbidly obese, critically ill Neuro: pupils pinpoint b/l and unresponsive to light. No gag or cough reflex. No response to sternal rub.  HEENT: OP clear, scant ETT secretions.  Cardiovascular: RRR no m/r/g Lungs: Coarse BS b/l Abdomen: Soft obese, NTND, BS absent, large volume BRBPR in bed under patient Musculoskeletal: no clubbing; 1+BLE edmea  Skin: no visible rashes   LABS:  BMET  Recent Labs Lab 11/11/2016 0033 11/18/2016 0420  NA 138 140  K 4.3 4.4  CL 105 115*  CO2  --  16*  BUN 23* 18  CREATININE 1.40* 2.16*  GLUCOSE 283* 164*    Electrolytes  Recent Labs Lab 10/21/2016 0420  CALCIUM 7.1*  MG 2.0  PHOS 8.2*    CBC  Recent Labs Lab 11/02/2016 0033 10/23/2016 0420  WBC  --  17.1*  HGB 13.9 8.9*  HCT 41.0 28.9*  PLT  --   120*    Coag's  Recent Labs Lab 10/24/2016 0420  APTT 134*  INR 3.97    Sepsis Markers  Recent Labs Lab 11/18/2016 0034 10/30/2016 0420  LATICACIDVEN 10.95* 8.0*    ABG  Recent Labs Lab 11/11/2016 0209 10/24/2016 0455 10/28/2016 0550  PHART 7.062* 6.997* 7.139*  PCO2ART 69.4* 51.9* 45.2  PO2ART 109.0* 108.0 107    Liver Enzymes  Recent Labs Lab 10/22/2016 0420  AST 478*  ALT 211*  ALKPHOS 90  BILITOT 0.9  ALBUMIN 2.0*    Cardiac Enzymes No results for input(s): TROPONINI, PROBNP in the last 168 hours.  Glucose  Recent Labs Lab 11/02/2016 0004 11/17/2016 0844  GLUCAP 276* 161*    Imaging Ct Head Wo Contrast  Result Date: 11/14/2016 CLINICAL DATA:  Respiratory distress, unresponsive patient EXAM: CT HEAD WITHOUT CONTRAST TECHNIQUE: Contiguous axial images were obtained from the base of the skull through the vertex without intravenous contrast. COMPARISON:  None. FINDINGS: Brain: No acute territorial infarction, hemorrhage, or intracranial mass is seen. Mild hypodensity in the periventricular white matter consistent with small vessel ischemic changes. Mild atrophy. Normal ventricle size. Vascular: No hyperdense vessels.  No unexpected calcification. Skull: No skull fracture. Soft tissue within the external auditory canals bilaterally. Sinuses/Orbits: No acute finding. Other: None IMPRESSION: No definite CT evidence for acute intracranial abnormality. Mild atrophy and small vessel ischemic changes of the white matter Electronically Signed   By: Jasmine Pang M.D.   On: 10/26/2016 03:51   Dg Chest Port 1 View  Result Date: 10/21/2016 CLINICAL DATA:  Central venous catheter.  Endotracheal tube EXAM: PORTABLE CHEST 1 VIEW COMPARISON:  11/16/2016 FINDINGS: Interval placement of right jugular central venous catheter with the tip in the SVC. No pneumothorax. Endotracheal tube is low and may be within the proximal right bronchus. Recommend withdrawal 4 cm. NG tube enters the  stomach. Right upper lobe airspace disease demonstrate progressive consolidation. Hypoventilation with bibasilar atelectasis IMPRESSION: Central line tip in the SVC Endotracheal tube in the proximal right bronchus, recommend withdrawal 4 cm Progressive consolidation right lower lobe Progression of bibasilar atelectasis. These results will be called to the ordering clinician or representative by the Radiologist Assistant, and communication documented in the PACS or zVision Dashboard. Electronically Signed   By: Marlan Palau M.D.   On: 10/26/2016 07:12   Dg Chest Portable 1 View  Result Date: 10/25/2016 CLINICAL DATA:  Post CPR EXAM: PORTABLE CHEST 1 VIEW COMPARISON:  Report 12/25/2010 FINDINGS: Endotracheal tube tip is just above the right mainstem bronchus. Esophageal tube tip extends below diaphragm but is not included on the image. Right upper lobe infiltrate. Mild cardiomegaly. Aortic atherosclerosis. No pneumothorax. IMPRESSION: 1. Endotracheal tube tip appears to be just above the right mainstem bronchus 2. Right upper lobe infiltrate 3. Borderline cardiomegaly with central vascular congestion Electronically Signed   By: Jasmine Pang M.D.   On: 11/10/2016 00:26   CULTURES: Blood cultures:  pending  ANTIBIOTICS: Vancomycin and Zosyn  LINES/TUBES: RIJ TLC ETT Foley catheter   ASSESSMENT / PLAN: 81yoF with hx HTN and Morbid obesity, admitted overnight with out of hospital cardiac arrest, s/p ROSC, with acute hypoxic respiratory failure and aspiration pneumonia requiring mechanical ventilation, severe shock (septic vs cardiogenic), anoxic encephalopathy, AKI, Shock liver, and GIB.   Overnight she was self-cooled to 35 degrees. Therapeutic hypothermia not started due to her significant bleeding. She was pancultured and started on antibiotics. Vasopressors started. This AM she continues to have significant BRBPR. SBP 42. Family (1 daughter and 1 close family friend) are at bedside and state that  patient is DNR and that we should not escalate care. Specifically they asked me not to increase the levophed infusion rate. They are awaiting the patient's son who is driving here from [place]. Discussed patient's multiorgan system failure with the family and the expectation that she will likely die today unfortunately. They are understanding of this and our goal to keep her comfortable.   PULMONARY 1. Acute hypoxic respiratory failure; Aspiration pneumonia: - ETT in right mainstem; asked RT to withdraw by 2cm and repeat CXR - obtain sputum culture; continue antibiotics as below - NPO; continue GI prophylaxis; no DVT prophylaxis given GIB - ABG continues to show severe acidosis which is mostly metabolic; RR increased from 24 to 26 earlier this AM; repeat ABG ordered.   CARDIOVASCULAR 1. Cardiac arrest; Shock; NSTEMI: - TTE ordered - no therapeutic hypothermia given significant GIB - Trop 0.41; continue to trend - continue levophed and vasopressin; family does NOT want [place] to up-titrate drips (do not escalate)  RENAL 1. Aneuric AKI: likely due to ATN from the shock - foley catheter in place - received 3L IVF bolus on admission; continue bicarb gtt  2. Severe lactic acidosis: - in the setting of the GIB and after the cardiac arrest, I am concerned this significant lactic acidosis is due to gut ischemia. - continue to trend lactate; continue Bicarb gtt  GASTROINTESTINAL 1. Significant GIB; Coagulopathy; Likely DIC: - INR 3.97 in patient not on anticoag at home; likely due to DIC - recheck INR and check fibrinogen now - continue PPI gtt for GIB - cancelled GI consult as patient is too unstable for intervention at this time; discussed with GI MD who is in agreement.   2. Shock liver: - continue to trend LFT's.   HEMATOLOGIC 1. Anemia:  - Hgb 8.9 down from 13.9 overnight; likely combination of dilutional effect from IVF boluses given, also contribution from significant GIB  INFECTIOUS 1.  Septic shock; Aspiration pneumonia: - shock presumed septic (but could be cardiogenic as well); continue pressors as discussed above - blood cultures pending; obtain sputum culture. Not making urine to obtain UA.  - continue vanc and zosyn; hydrocortisone.   ENDOCRINE No acute issues   NEUROLOGIC 1. Anoxic encephalopathy: - having episodes of myoclonic jerking per RN; none on my exam - head CT shows mild white matter ischemia - if stabilizes will obtain EEG, but unfortunately expect her to not survive the day.    FAMILY  - Updated patient's daughter who is at the bedside. Offered chaplain consult, but they said their pastor was on the way.   60 minutes critical care time  Milana Obey, MD  Pulmonary and Critical Care Medicine [facility] Pager: [phone]  10/31/2016, 9:01 AM

## 2016-11-20 NOTE — Progress Notes (Signed)
ANTICOAGULATION CONSULT NOTE - Initial Consult  Pharmacy Consult for Heparin Indication: R/O PE  No Known Allergies  Patient Measurements: Height:  (172.7 cm) Weight: 275 lb (124.7 kg) IBW/kg (Calculated) : 63.9 Heparin Dosing Weight: 90 kg  Vital Signs: Temp: 97.9 F (36.6 C) (09/18 0002) Temp Source: Temporal (09/18 0002) BP: 85/62 (09/18 0100) Pulse Rate: 120 (09/18 0100)  Labs:  Recent Labs  2016/12/03 0033  HGB 13.9  HCT 41.0  CREATININE 1.40*    Estimated Creatinine Clearance: 45.4 mL/min (A) (by C-G formula based on SCr of 1.4 mg/dL (H)).   Medical History: Past Medical History:  Diagnosis Date  . Arthritis   . HOH (hard of hearing)   . Hypercholesteremia   . Hypertension   . Obesity     Medications:  No current facility-administered medications on file prior to encounter.    Current Outpatient Prescriptions on File Prior to Encounter  Medication Sig Dispense Refill  . acetaminophen (TYLENOL) 650 MG CR tablet Take 1,300 mg by mouth daily.    Marland Kitchen aspirin EC 81 MG tablet Take 81 mg by mouth at bedtime.    . enalapril-hydrochlorothiazide (VASERETIC) 10-25 MG tablet Take 1 tablet by mouth daily with breakfast.  1  . ENSURE PLUS (ENSURE PLUS) LIQD Take 237 mLs by mouth daily as needed (when not eating).    . pravastatin (PRAVACHOL) 40 MG tablet Take 40 mg by mouth every evening.   0  . Vitamin D, Ergocalciferol, (DRISDOL) 50000 units CAPS capsule Take 50,000 Units by mouth every 7 (seven) days. Sundays  6     Assessment: 81 y.o. female admitted s/p cardiac arrest, possible PE, for heparin Goal of Therapy:  Heparin level 0.3-0.7 units/ml Monitor platelets by anticoagulation protocol: Yes   Plan:  Heparin 4000 units IV bolus, then start heparin 1400 units/hr Check heparin level in 6 hours.   Aila Terra, Gary Fleet 2016-12-03,1:33 AM

## 2016-11-20 NOTE — Care Management Note (Signed)
Case Management Note  Patient Details  Name: Megan Roberts MRN: [id number] Date of Birth: 18-Jun-1935  Subjective/Objective:   Expired.                Action/Plan:   Expected Discharge Date:                  Expected Discharge Plan:     In-House Referral:     Discharge planning Services  CM Consult  Post Acute Care Choice:    Choice offered to:     DME Arranged:    DME Agency:     HH Arranged:    HH Agency:     Status of Service:  Completed, signed off  If discussed at [employer] of Stay Meetings, dates discussed:    Additional Comments:  Leone Haven, RN 11/08/2016, 10:39 AM

## 2016-11-20 NOTE — Progress Notes (Addendum)
..  CRITICAL CARE Performed by: Gypsy Balsam Cedricka Sackrider Pt continued to worsen. GI bleeding via OGT and per rectum on Protonix ggt Hemodynamically unstable on Levophed at 40 Right femoral A- line placed by NP after failed radial attempts. Central venous catheter placed by me in RIJ. = CVP transduced 12 Vasopressin 0.04 units started  Repeat ABG shows worsening acidosis Pushed a total of 3 amps of bicarb and started on Bicarb ggt at 150 for refractory metabolic acidosis.  Started on Hydrocortisone IV Initially BP improved but did not maintain 2 units of blood being transfused Having witnessed seizure activity  Push dose of epi administered and BP transiently responded I had a frank discussion with her daughter, next surviving relative is her brother who is a truck driver headed back here. She asked [place] to do everything possible to keep her alive until her brother came. Explained patient's prognosis is poor She has agreed to no escalation of care beyond what we have already done and pt is partial code with no compressions. Resuscitation status has been updated in chart     Total critical care time: 120 minutes (including initial evaluation) Critical care time was exclusive of separately billable procedures and treating other patients.  Critical care was necessary to manage the imminent  life-threatening deterioration. Critical care was time spent personally by me on the following activities: development of treatment plan with patient and/or surrogate as well as nursing, discussions with consultants, evaluation of patient's response to treatment, examination of patient, obtaining history from patient or surrogate, ordering and performing treatments and interventions, ordering and review of laboratory studies, ordering and review of radiographic studies, pulse oximetry and re-evaluation of patient's condition.  Signed Dr Newell Coral Pulmonary Critical Care Locums

## 2016-11-20 NOTE — Progress Notes (Signed)
Gave additional 0.1 mg of epinephrine push per Dr. Carlota Raspberry.

## 2016-11-20 NOTE — Progress Notes (Signed)
MD wanted to increase rate to 26.

## 2016-11-20 NOTE — Progress Notes (Signed)
Pharmacy Antibiotic Note  Megan Roberts is a 81 y.o. female s/p PEA cardiac arrest admitted on 24-Nov-2016 with VDRF and sepsis .  Pharmacy has been consulted for  Vancomycin and Zosyn and Levaquin dosing.  Plan: Vancomycin 1500 mg IV q48h Zosyn 3.375 g IV q8h Levaquin 750 mg IV q48h  Height:  (172.7 cm) Weight: 275 lb (124.7 kg) IBW/kg (Calculated) : 63.9  Temp (24hrs), Avg:97.9 F (36.6 C), Min:97.9 F (36.6 C), Max:97.9 F (36.6 C)   Recent Labs Lab 10/29/2016 0033 10/29/2016 0034  CREATININE 1.40*  --   LATICACIDVEN  --  10.95*    Estimated Creatinine Clearance: 45.4 mL/min (A) (by C-G formula based on SCr of 1.4 mg/dL (H)).    No Known Allergies    Eddie Candle 11/10/2016 2:43 AM

## 2016-11-20 NOTE — Progress Notes (Signed)
eLink Physician-Brief Progress Note Patient Name: Megan Roberts DOB: Oct 15, 1935 MRN: [id number]   Date of Service  12-05-2016  HPI/Events of Note  ABG reviewed. PH 7.139 with PCO2 45. Currently ventilator ordered for 6 mL/kg ideal body weight. Set rate of 26. Patient overbreathing the ventilator to approximately 30 breaths per minute. Peak pressure fluctuant but in the upper 20s mostly. Currently on PEEP 5 & FiO2 1.0. Currently on bicarbonate drip at 150 mL per hour.   eICU Interventions  1. Continuing bicarbonate drip rate 2. Increasing ventilator set rate to 32 breaths per minute 3. Increasing from 6 mL/per kilogram ideal body weight to 8 mL/kg ideal body weight      Intervention Category Major Interventions: Acid-Base disturbance - evaluation and management  Lawanda Cousins December 05, 2016, 6:53 AM

## 2016-11-20 NOTE — ED Notes (Signed)
Ice packs discontinued per admitting MD .

## 2016-11-20 DEATH — deceased

## 2018-04-09 IMAGING — CT CT HEAD W/O CM
4 series · 16 of 47 positions shown, 18 images · non-contrast
Comparison: None.

CLINICAL DATA: Respiratory distress, unresponsive patient

EXAM:
CT HEAD WITHOUT CONTRAST
TECHNIQUE: Contiguous axial images were obtained from the base of the skull
through the vertex without intravenous contrast.

[Series 3: head without · axial · non-contrast · 0.49mm/px · z∈[-117,-2]mm · 7 of 31 slices shown, 9 images]
[im 4/31  brain]
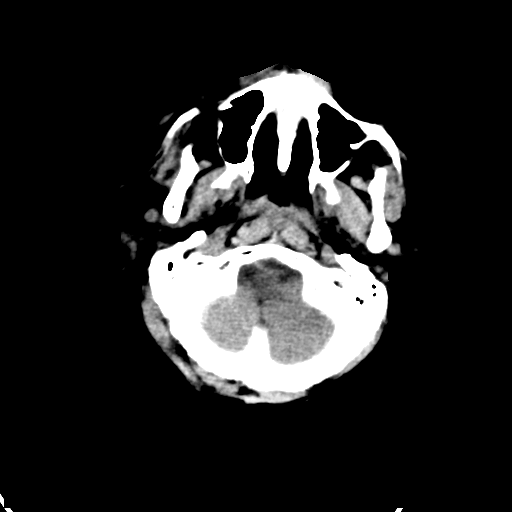
[im 4/31  bone]
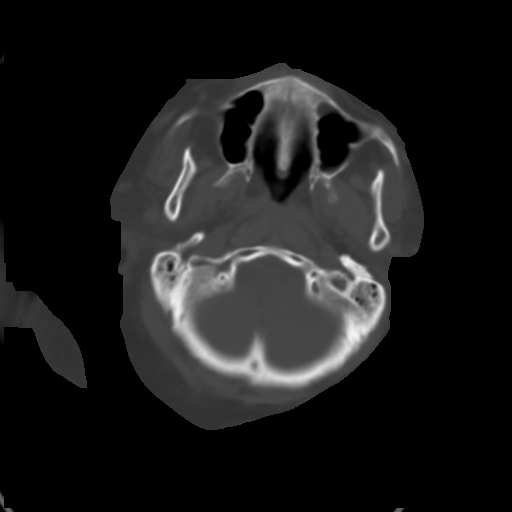
[im 8/31  brain]
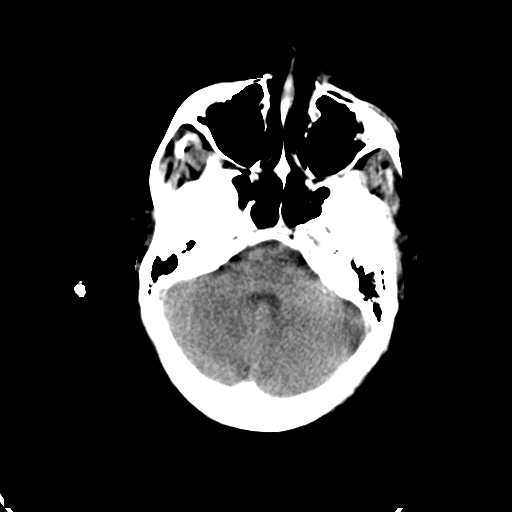
[im 12/31  brain]
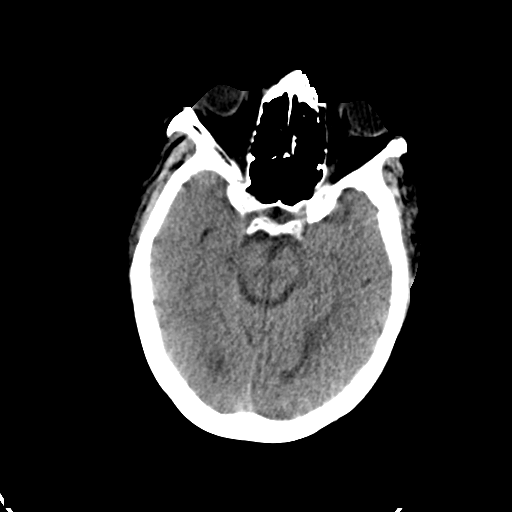
[im 16/31  brain]
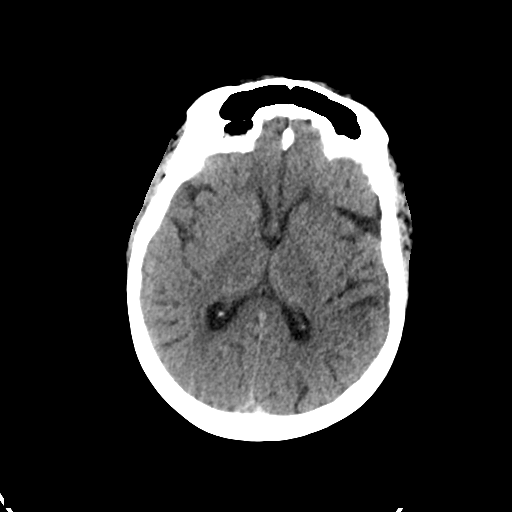
[im 19/31  brain]
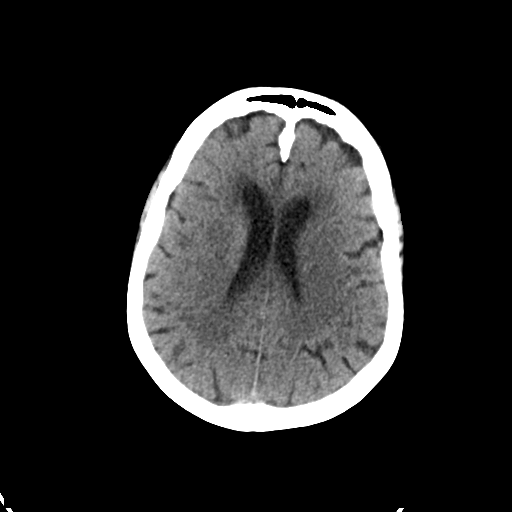
[im 19/31  bone]
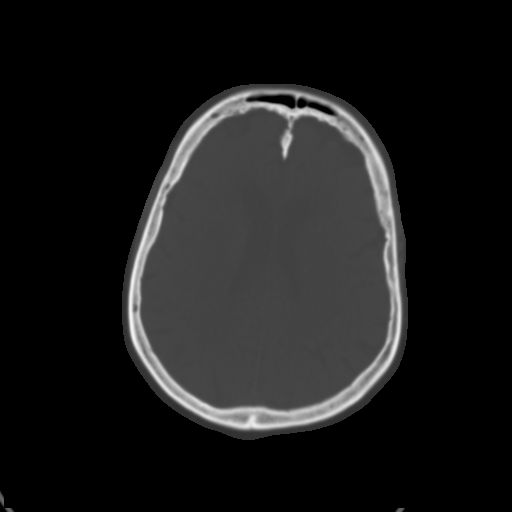
[im 23/31  brain]
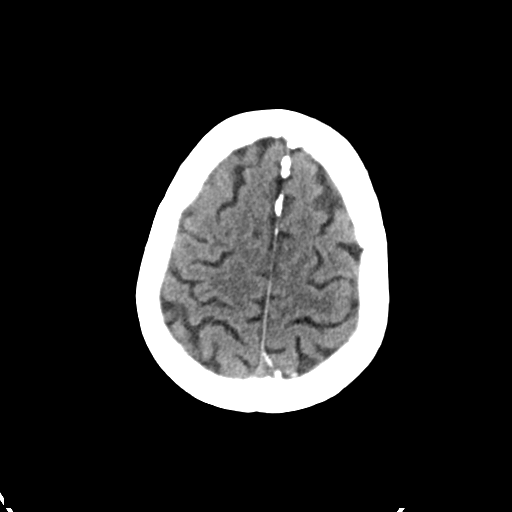
[im 27/31  brain]
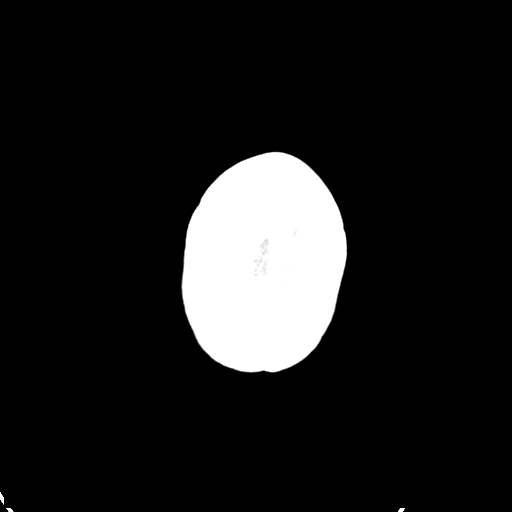

[Series 4: head bone · axial · 0.49mm/px · z∈[-118,-86]mm · 3 of 78 slices shown]
[im 8/78  bone]
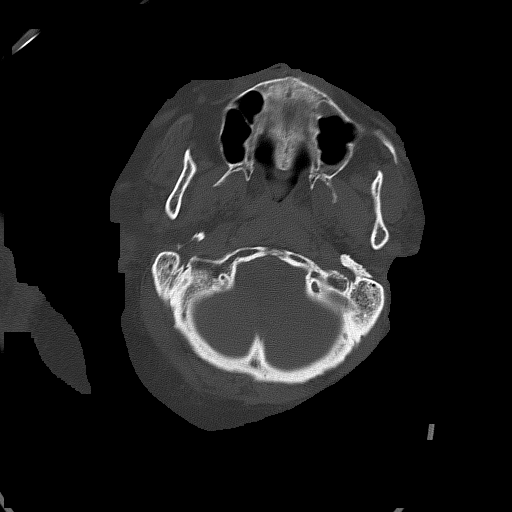
[im 16/78  bone]
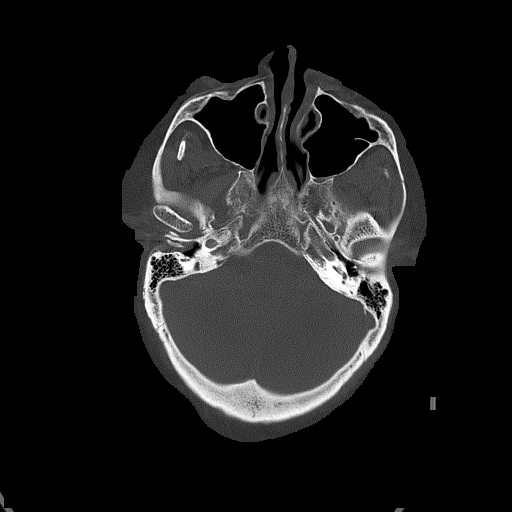
[im 24/78  bone]
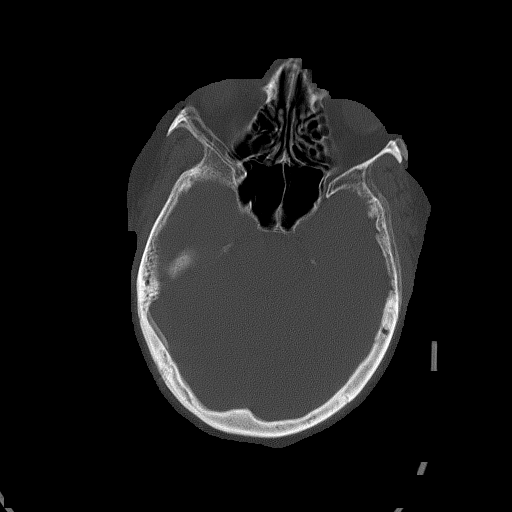

[Series 5: head without cor · coronal · non-contrast · 0.31mm/px · 3 of 67 slices shown]
[im 23/67  brain]
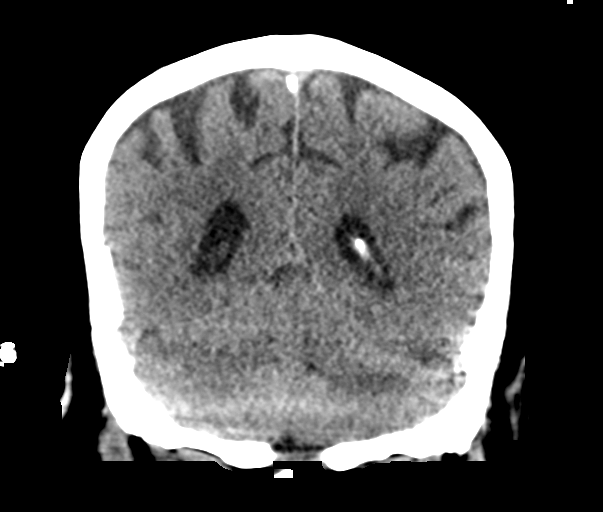
[im 30/67  brain]
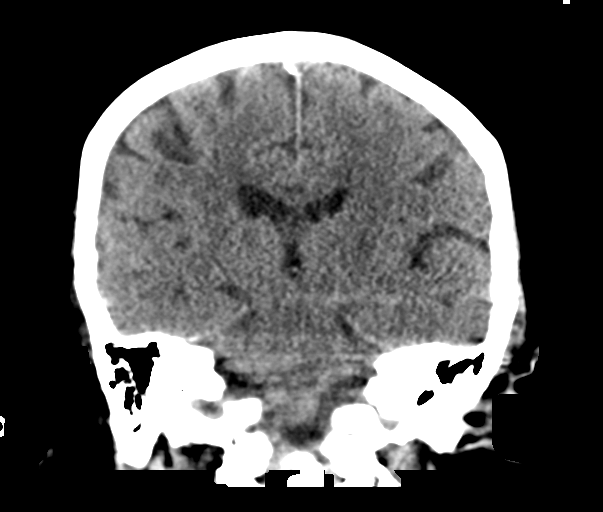
[im 37/67  brain]
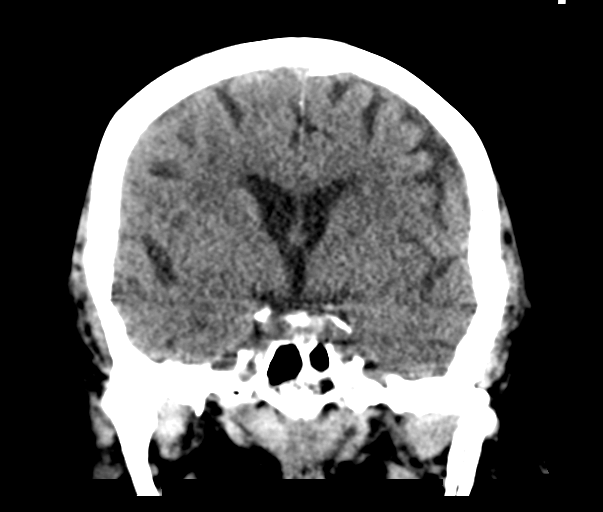

[Series 6: head without sag · sagittal · non-contrast · 0.30mm/px · 3 of 55 slices shown]
[im 19/55  brain]
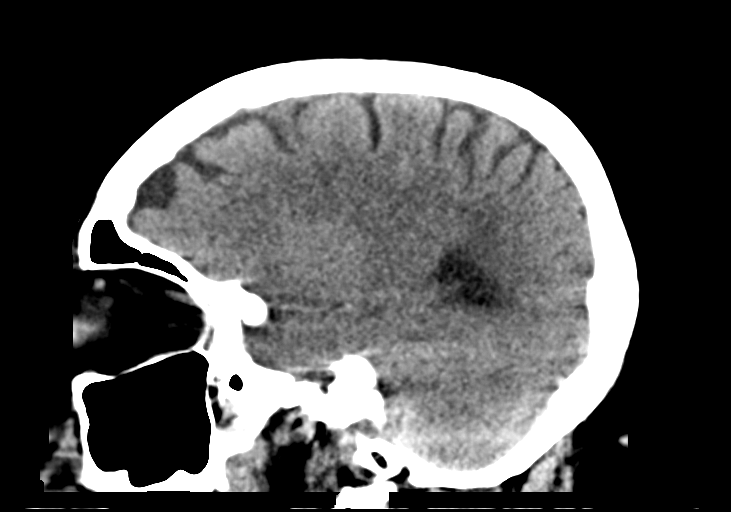
[im 28/55  brain]
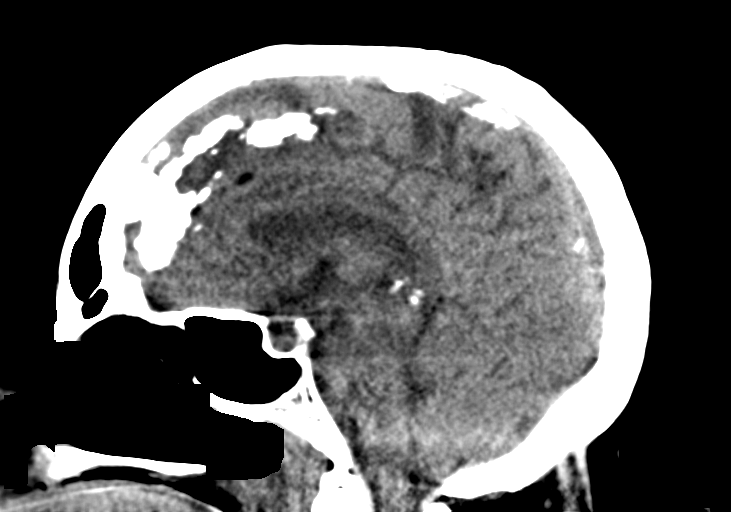
[im 37/55  brain]
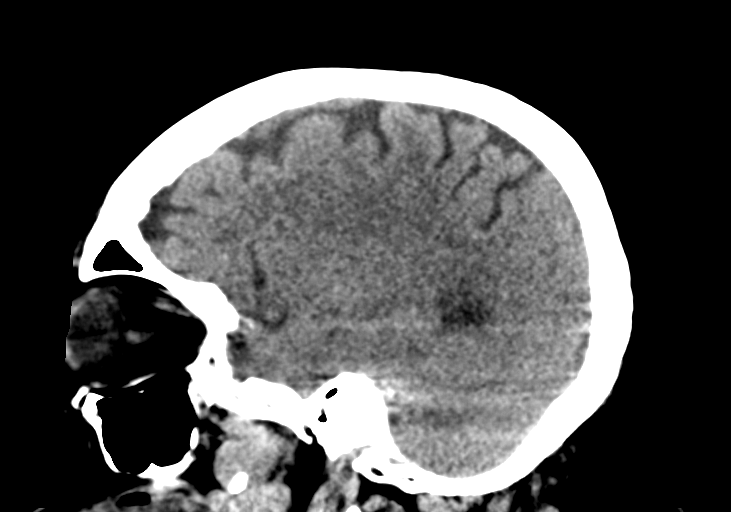

[16 of 47 positions shown; findings below may reference images not displayed]

FINDINGS: Brain: No acute territorial infarction, hemorrhage, or intracranial
mass is seen. Mild hypodensity in the periventricular white matter
consistent with small vessel ischemic changes. Mild atrophy. Normal
ventricle size.

Vascular: No hyperdense vessels.  No unexpected calcification.

Skull: No skull fracture. Soft tissue within the external auditory
canals bilaterally.

Sinuses/Orbits: No acute finding.

Other: None
IMPRESSION: No definite CT evidence for acute intracranial abnormality. Mild
atrophy and small vessel ischemic changes of the white matter
